# Patient Record
Sex: Male | Born: 1995 | Race: Black or African American | Hispanic: No | Marital: Single | Smoking: Current every day smoker
Health system: Southern US, Community
[De-identification: ages and names within clinical notes are randomized; demographics above are authoritative.]

## PROBLEM LIST (undated history)

## (undated) DIAGNOSIS — J40 Bronchitis, not specified as acute or chronic: Secondary | ICD-10-CM

## (undated) HISTORY — PX: APPENDECTOMY: SHX54

---
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
## 2015-07-08 ENCOUNTER — Encounter (HOSPITAL_BASED_OUTPATIENT_CLINIC_OR_DEPARTMENT_OTHER): Payer: Self-pay | Admitting: *Deleted

## 2015-07-08 ENCOUNTER — Emergency Department (HOSPITAL_BASED_OUTPATIENT_CLINIC_OR_DEPARTMENT_OTHER): Payer: Medicaid Other

## 2015-07-08 ENCOUNTER — Emergency Department (HOSPITAL_BASED_OUTPATIENT_CLINIC_OR_DEPARTMENT_OTHER)
Admission: EM | Admit: 2015-07-08 | Discharge: 2015-07-08 | Disposition: A | Discharge status: Home / Self Care | Payer: Medicaid Other | Attending: Emergency Medicine | Admitting: Emergency Medicine

## 2015-07-08 DIAGNOSIS — Z720 Tobacco use: Secondary | ICD-10-CM | POA: Diagnosis not present

## 2015-07-08 DIAGNOSIS — Y92410 Unspecified street and highway as the place of occurrence of the external cause: Secondary | ICD-10-CM | POA: Insufficient documentation

## 2015-07-08 DIAGNOSIS — Z8709 Personal history of other diseases of the respiratory system: Secondary | ICD-10-CM | POA: Insufficient documentation

## 2015-07-08 DIAGNOSIS — S199XXA Unspecified injury of neck, initial encounter: Secondary | ICD-10-CM | POA: Diagnosis not present

## 2015-07-08 DIAGNOSIS — S3992XA Unspecified injury of lower back, initial encounter: Secondary | ICD-10-CM | POA: Insufficient documentation

## 2015-07-08 DIAGNOSIS — Y998 Other external cause status: Secondary | ICD-10-CM | POA: Insufficient documentation

## 2015-07-08 DIAGNOSIS — M545 Low back pain, unspecified: Secondary | ICD-10-CM

## 2015-07-08 DIAGNOSIS — Z79899 Other long term (current) drug therapy: Secondary | ICD-10-CM | POA: Diagnosis not present

## 2015-07-08 DIAGNOSIS — Y9389 Activity, other specified: Secondary | ICD-10-CM | POA: Insufficient documentation

## 2015-07-08 HISTORY — DX: Bronchitis, not specified as acute or chronic: J40

## 2015-07-08 MED ORDER — IBUPROFEN 800 MG PO TABS
800.0000 mg | ORAL_TABLET | Freq: Once | ORAL | Status: AC
  Administered 2015-07-08: 800 mg via ORAL
  Filled 2015-07-08: qty 1

## 2015-07-08 MED ORDER — CYCLOBENZAPRINE HCL 10 MG PO TABS: 10.0000 mg | ORAL_TABLET | Freq: Two times a day (BID) | ORAL | Status: DC | PRN

## 2015-07-08 MED ORDER — HYDROCODONE-ACETAMINOPHEN 5-325 MG PO TABS: 2.0000 | ORAL_TABLET | Freq: Four times a day (QID) | ORAL | Status: DC | PRN

## 2015-07-08 MED ORDER — NAPROXEN 500 MG PO TABS: 500.0000 mg | ORAL_TABLET | Freq: Two times a day (BID) | ORAL | Status: DC
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
## 2015-07-08 NOTE — Discharge Instructions (Signed)
Back Exercises These exercises may help you when beginning to rehabilitate your injury. Your symptoms may resolve with or without further involvement from your physician, physical therapist or athletic trainer. While completing these exercises, remember:   Restoring tissue flexibility helps normal motion to return to the joints. This allows healthier, less painful movement and activity.  An effective stretch should be held for at least 30 seconds.  A stretch should never be painful. You should only feel a gentle lengthening or release in the stretched tissue. STRETCH - Extension, Prone on Elbows   Lie on your stomach on the floor, a bed will be too soft. Place your palms about shoulder width apart and at the height of your head.  Place your elbows under your shoulders. If this is too painful, stack pillows under your chest.  Allow your body to relax so that your hips drop lower and make contact more completely with the floor.  Hold this position for __________ seconds.  Slowly return to lying flat on the floor. Repeat __________ times. Complete this exercise __________ times per day.  RANGE OF MOTION - Extension, Prone Press Ups   Lie on your stomach on the floor, a bed will be too soft. Place your palms about shoulder width apart and at the height of your head.  Keeping your back as relaxed as possible, slowly straighten your elbows while keeping your hips on the floor. You may adjust the placement of your hands to maximize your comfort. As you gain motion, your hands will come more underneath your shoulders.  Hold this position __________ seconds.  Slowly return to lying flat on the floor. Repeat __________ times. Complete this exercise __________ times per day.  RANGE OF MOTION- Quadruped, Neutral Spine   Assume a hands and knees position on a firm surface. Keep your hands under your shoulders and your knees under your hips. You may place padding under your knees for  comfort.  Drop your head and point your tail bone toward the ground below you. This will round out your low back like an angry cat. Hold this position for __________ seconds.  Slowly lift your head and release your tail bone so that your back sags into a large arch, like an old horse.  Hold this position for __________ seconds.  Repeat this until you feel limber in your low back.  Now, find your "sweet spot." This will be the most comfortable position somewhere between the two previous positions. This is your neutral spine. Once you have found this position, tense your stomach muscles to support your low back.  Hold this position for __________ seconds. Repeat __________ times. Complete this exercise __________ times per day.  STRETCH - Flexion, Single Knee to Chest   Lie on a firm bed or floor with both legs extended in front of you.  Keeping one leg in contact with the floor, bring your opposite knee to your chest. Hold your leg in place by either grabbing behind your thigh or at your knee.  Pull until you feel a gentle stretch in your low back. Hold __________ seconds.  Slowly release your grasp and repeat the exercise with the opposite side. Repeat __________ times. Complete this exercise __________ times per day.  STRETCH - Hamstrings, Standing  Stand or sit and extend your right / left leg, placing your foot on a chair or foot stool  Keeping a slight arch in your low back and your hips straight forward.  Lead with your chest and   lean forward at the waist until you feel a gentle stretch in the back of your right / left knee or thigh. (When done correctly, this exercise requires leaning only a small distance.)  Hold this position for __________ seconds. Repeat __________ times. Complete this stretch __________ times per day. STRENGTHENING - Deep Abdominals, Pelvic Tilt   Lie on a firm bed or floor. Keeping your legs in front of you, bend your knees so they are both pointed  toward the ceiling and your feet are flat on the floor.  Tense your lower abdominal muscles to press your low back into the floor. This motion will rotate your pelvis so that your tail bone is scooping upwards rather than pointing at your feet or into the floor.  With a gentle tension and even breathing, hold this position for __________ seconds. Repeat __________ times. Complete this exercise __________ times per day.  STRENGTHENING - Abdominals, Crunches   Lie on a firm bed or floor. Keeping your legs in front of you, bend your knees so they are both pointed toward the ceiling and your feet are flat on the floor. Cross your arms over your chest.  Slightly tip your chin down without bending your neck.  Tense your abdominals and slowly lift your trunk high enough to just clear your shoulder blades. Lifting higher can put excessive stress on the low back and does not further strengthen your abdominal muscles.  Control your return to the starting position. Repeat __________ times. Complete this exercise __________ times per day.  STRENGTHENING - Quadruped, Opposite UE/LE Lift   Assume a hands and knees position on a firm surface. Keep your hands under your shoulders and your knees under your hips. You may place padding under your knees for comfort.  Find your neutral spine and gently tense your abdominal muscles so that you can maintain this position. Your shoulders and hips should form a rectangle that is parallel with the floor and is not twisted.  Keeping your trunk steady, lift your right hand no higher than your shoulder and then your left leg no higher than your hip. Make sure you are not holding your breath. Hold this position __________ seconds.  Continuing to keep your abdominal muscles tense and your back steady, slowly return to your starting position. Repeat with the opposite arm and leg. Repeat __________ times. Complete this exercise __________ times per day. Document Released:  12/07/2005 Document Revised: 02/11/2012 Document Reviewed: 03/03/2009 [employer] Patient Information 2015 [employer]. This information is not intended to replace advice given to you by your health care provider. Make sure you discuss any questions you have with your health care provider.  

## 2016-03-02 ENCOUNTER — Emergency Department (HOSPITAL_BASED_OUTPATIENT_CLINIC_OR_DEPARTMENT_OTHER)
Admission: EM | Admit: 2016-03-02 | Discharge: 2016-03-02 | Disposition: A | Discharge status: Home / Self Care | Payer: Medicaid Other | Attending: Emergency Medicine | Admitting: Emergency Medicine

## 2016-03-02 ENCOUNTER — Encounter (HOSPITAL_BASED_OUTPATIENT_CLINIC_OR_DEPARTMENT_OTHER): Payer: Self-pay | Admitting: *Deleted

## 2016-03-02 DIAGNOSIS — Z79899 Other long term (current) drug therapy: Secondary | ICD-10-CM | POA: Diagnosis not present

## 2016-03-02 DIAGNOSIS — R197 Diarrhea, unspecified: Secondary | ICD-10-CM | POA: Insufficient documentation

## 2016-03-02 DIAGNOSIS — Z8709 Personal history of other diseases of the respiratory system: Secondary | ICD-10-CM | POA: Insufficient documentation

## 2016-03-02 DIAGNOSIS — F17210 Nicotine dependence, cigarettes, uncomplicated: Secondary | ICD-10-CM | POA: Diagnosis not present

## 2016-03-02 DIAGNOSIS — R112 Nausea with vomiting, unspecified: Secondary | ICD-10-CM | POA: Diagnosis not present

## 2016-03-02 DIAGNOSIS — R1084 Generalized abdominal pain: Secondary | ICD-10-CM | POA: Diagnosis present

## 2016-03-02 LAB — BASIC METABOLIC PANEL
ANION GAP: 9 (ref 5–15)
BUN: 11 mg/dL (ref 6–20)
CO2: 26 mmol/L (ref 22–32)
Calcium: 9.5 mg/dL (ref 8.9–10.3)
Chloride: 104 mmol/L (ref 101–111)
Creatinine, Ser: 1.07 mg/dL (ref 0.61–1.24)
GFR calc Af Amer: >60 mL/min (ref >60)
GFR calc non Af Amer: >60 mL/min (ref >60)
GLUCOSE: 132 mg/dL — AB (ref 65–99)
POTASSIUM: 3.7 mmol/L (ref 3.5–5.1)
Sodium: 139 mmol/L (ref 135–145)

## 2016-03-02 LAB — URINALYSIS, ROUTINE W REFLEX MICROSCOPIC
Bilirubin Urine: NEGATIVE
Glucose, UA: NEGATIVE mg/dL
HGB URINE DIPSTICK: NEGATIVE
Ketones, ur: NEGATIVE mg/dL
Nitrite: NEGATIVE
Protein, ur: 30 mg/dL — AB
SPECIFIC GRAVITY, URINE: 1.027 (ref 1.005–1.030)
pH: 8.5 — ABNORMAL HIGH (ref 5.0–8.0)

## 2016-03-02 LAB — CBC WITH DIFFERENTIAL/PLATELET
Basophils Absolute: 0 10*3/uL (ref 0.0–0.1)
Basophils Relative: 0 %
EOS PCT: 3 %
Eosinophils Absolute: 0.3 10*3/uL (ref 0.0–0.7)
HCT: 48.6 % (ref 39.0–52.0)
HEMOGLOBIN: 16.7 g/dL (ref 13.0–17.0)
LYMPHS PCT: 21 %
Lymphs Abs: 1.9 10*3/uL (ref 0.7–4.0)
MCH: 29.9 pg (ref 26.0–34.0)
MCHC: 34.4 g/dL (ref 30.0–36.0)
MCV: 87.1 fL (ref 78.0–100.0)
Monocytes Absolute: 0.5 10*3/uL (ref 0.1–1.0)
Monocytes Relative: 6 %
NEUTROS ABS: 6.2 10*3/uL (ref 1.7–7.7)
NEUTROS PCT: 70 %
Platelets: 260 10*3/uL (ref 150–400)
RBC: 5.58 MIL/uL (ref 4.22–5.81)
RDW: 13.6 % (ref 11.5–15.5)
WBC: 8.9 10*3/uL (ref 4.0–10.5)

## 2016-03-02 LAB — LIPASE, BLOOD: Lipase: 34 U/L (ref 11–51)

## 2016-03-02 LAB — URINE MICROSCOPIC-ADD ON: RBC / HPF: NONE SEEN RBC/hpf (ref 0–5)

## 2016-03-02 MED ORDER — ONDANSETRON HCL 4 MG/2ML IJ SOLN
4.0000 mg | Freq: Once | INTRAMUSCULAR | Status: AC
  Administered 2016-03-02: 4 mg via INTRAVENOUS
  Filled 2016-03-02: qty 2

## 2016-03-02 MED ORDER — ONDANSETRON 4 MG PO TBDP: 4.0000 mg | ORAL_TABLET | Freq: Three times a day (TID) | ORAL | Status: DC | PRN

## 2016-03-02 MED ORDER — HYDROMORPHONE HCL 1 MG/ML IJ SOLN
0.5000 mg | Freq: Once | INTRAMUSCULAR | Status: AC
  Administered 2016-03-02: 0.5 mg via INTRAVENOUS
  Filled 2016-03-02: qty 1

## 2016-03-02 MED ORDER — SODIUM CHLORIDE 0.9 % IV BOLUS (SEPSIS)
1000.0000 mL | Freq: Once | INTRAVENOUS | Status: AC
  Administered 2016-03-02: 1000 mL via INTRAVENOUS

## 2016-03-02 MED ORDER — OXYCODONE HCL 5 MG PO TABS: 5.0000 mg | ORAL_TABLET | Freq: Four times a day (QID) | ORAL | Status: DC | PRN

## 2016-03-02 NOTE — ED Notes (Signed)
Pt father requests this rn at desk. Pt father states "You told me that the doctor had seen my son. You weren't supposed to tell me nothing. You broke that law when you told me the doctor had seen my son, I want to talk to your supervisor!"

## 2016-03-02 NOTE — ED Notes (Signed)
Pt made aware to return if symptoms worsen or if any life threatening symptoms occur.   

## 2016-03-02 NOTE — Discharge Instructions (Signed)
Take zofran as needed for nausea. Use the pain medication only as needed! This can make you very drowsy - please do not drink or drive on this medication. This can also make constipation worse.   Drink plenty of fluids.  Follow up with your primary physician in 1-2 days.  Return to the ER for new or worsening symptoms, any additional concerns.

## 2016-03-02 NOTE — ED Notes (Signed)
Lorin PicketScott, AD notified of pt request to speak with my supervisor.

## 2016-03-02 NOTE — ED Provider Notes (Signed)
CSN: [id number]     Arrival date & time 03/02/16  1333 History   First MD Initiated Contact with Patient 03/02/16 1442     Chief Complaint  Patient presents with  . Abdominal Pain     (Consider location/radiation/quality/duration/timing/severity/associated sxs/prior Treatment) Patient is a 20 y.o. male presenting with abdominal pain. The history is provided by the patient and medical records. No language interpreter was used.  Abdominal Pain Associated symptoms: diarrhea, nausea and vomiting   Associated symptoms: no chills, no cough, no dysuria, no fever, no shortness of breath and no sore throat    Dennis Simmons is a 20 y.o. male  with no pertinent PMH who presents to the Emergency Department complaining of gradually worsening generalized abdominal pain since yesterday. Associated symptoms include nausea, NBNB emesis (5 episodes today), two episodes of small amount of non-bloody diarrhea. Mother with similar sxs a few days prior. "Anti diarrhea" medication of mother's taken PTA, however patient does not know name of med. No other medications taken for symptoms. Patient denies chest pain, shortness of breath, fever.  Past Medical History  Diagnosis Date  . Bronchitis    History reviewed. No pertinent past surgical history. History reviewed. No pertinent family history. Social History  Substance Use Topics  . Smoking status: Current Every Day Smoker -- 0.50 packs/day    Types: Cigars, Cigarettes  . Smokeless tobacco: None  . Alcohol Use: No    Review of Systems  Constitutional: Negative for fever and chills.  HENT: Negative for congestion and sore throat.   Eyes: Negative for visual disturbance.  Respiratory: Negative for cough and shortness of breath.   Cardiovascular: Negative.   Gastrointestinal: Positive for nausea, vomiting, abdominal pain and diarrhea.  Genitourinary: Negative for dysuria.  Musculoskeletal: Negative for back pain and neck pain.  Skin: Negative for rash.   Neurological: Negative for dizziness and headaches.      Allergies  Tylenol  Home Medications   Prior to Admission medications   Medication Sig Start Date End Date Taking? Authorizing Provider  albuterol (PROVENTIL HFA;VENTOLIN HFA) 108 (90 BASE) MCG/ACT inhaler Inhale 2 puffs into the lungs every 6 (six) hours as needed for wheezing or shortness of breath.    Historical Provider, MD  amphetamine-dextroamphetamine (ADDERALL XR) 30 MG 24 hr capsule Take 30 mg by mouth daily.    Historical Provider, MD  ondansetron (ZOFRAN ODT) 4 MG disintegrating tablet Take 1 tablet (4 mg total) by mouth every 8 (eight) hours as needed for nausea or vomiting. 03/02/16   Chase PicketJaime Pilcher Emrey Thornley, PA-C  oxyCODONE (ROXICODONE) 5 MG immediate release tablet Take 1 tablet (5 mg total) by mouth every 6 (six) hours as needed for severe pain. 03/02/16   Shala Baumbach Pilcher [place] Throckmorton, PA-C   BP 111/65 mmHg  Pulse 85  Temp(Src) 98.6 F (37 C) (Oral)  Resp 18  Ht 6\' 3"  (1.905 m)  Wt 124.739 kg  BMI 34.37 kg/m2  SpO2 99% Physical Exam  Constitutional: He is oriented to person, place, and time. He appears well-developed and well-nourished.  Alert  HENT:  Head: Normocephalic and atraumatic.  Mouth/Throat: Oropharynx is clear and moist. No oropharyngeal exudate.  Tacky mucous membranes.  Cardiovascular: Normal rate, regular rhythm, normal heart sounds and intact distal pulses.  Exam reveals no gallop and no friction rub.   No murmur heard. Pulmonary/Chest: Effort normal and breath sounds normal. No respiratory distress. He has no wheezes. He has no rales.  Abdominal: Soft. Bowel sounds are normal. He exhibits  no distension and no mass. There is tenderness (Generalized). There is no rebound, no guarding and no tenderness at McBurney's point.  Negative psoas, negative obturator.   Musculoskeletal: Normal range of motion.  Neurological: He is alert and oriented to person, place, and time.  Skin: Skin is warm and dry.  Cap  refill less than 3 seconds.  Nursing note and vitals reviewed.   ED Course  Procedures (including critical care time) Labs Review Labs Reviewed  URINALYSIS, ROUTINE W REFLEX MICROSCOPIC (NOT AT [facility]) - Abnormal; Notable for the following:    APPearance CLOUDY (*)    pH 8.5 (*)    Protein, ur 30 (*)    Leukocytes, UA TRACE (*)    All other components within normal limits  BASIC METABOLIC PANEL - Abnormal; Notable for the following:    Glucose, Bld 132 (*)    All other components within normal limits  URINE MICROSCOPIC-ADD ON - Abnormal; Notable for the following:    Squamous Epithelial / LPF 0-5 (*)    Bacteria, UA FEW (*)    All other components within normal limits  CBC WITH DIFFERENTIAL/PLATELET  LIPASE, BLOOD    Imaging Review No results found. I have personally reviewed and evaluated these images and lab results as part of my medical decision-making.   EKG Interpretation None      MDM   Final diagnoses:  Generalized abdominal pain   Dennis Simmons is an otherwise healthy 20 y.o. male who presents for generalized abdominal pain, n/v/d. Small amounts of loose stool today. On exam, patient with non-surgical abdomen. Generalized TTP. Abdomen was difficult to assess given patient was writhing around in the bed and requesting pain meds. 0.5mg  Dilaudid and zofran given, then will reassess. Mildly tachycardic, 1L fluids given.   Patient re-evaluated after pain medication and feels improved. Repeat abdominal exam with non-surgical abdomen and generalized tenderness. No emesis since medication was given. Heart rate has improved.  Patient is nontoxic, nonseptic appearing. Patient's pain and other symptoms adequately managed in emergency department. Fluid bolus given. Labs reviewed and reassuring. Patient does not meet the SIRS or Sepsis criteria. No indication of appendicitis, bowel obstruction, bowel perforation, cholecystitis, diverticulitis. Patient discharged home with  symptomatic treatment and given strict instructions for follow-up with their primary care physician. I have also discussed reasons to return immediately to the ER. Patient expresses understanding and agrees with plan. All questions answered.   [facility] Dala Breault, PA-C 03/02/16 1715  Vanetta Mulders, MD 03/07/16 [phone]

## 2016-03-02 NOTE — ED Notes (Signed)
Requested urine x2

## 2016-03-02 NOTE — ED Notes (Signed)
Pt father calls this rn again, states "My daughter just called me and says no one has seen him and you all are not giving him any pain medicine." explained again to pt father that I cannot discuss specific pt information with him, but that pt has been seen by a physician. Pt shouting at this rn "You don't have to tell me shit! I'm on my way, I'll be there in an hour and a half and you'll tell me then!" and hangs up.

## 2016-03-02 NOTE — ED Notes (Signed)
Pt requesting pain medication, Dennis MuirJamie, [place] notified

## 2016-03-02 NOTE — ED Notes (Signed)
Pt reports abdominal pain since yesterday.  Pt very vague in triage.

## 2016-03-02 NOTE — ED Notes (Signed)
Pt removed IV by self because he was upset that he was having to wait too long for the doctor.

## 2016-03-02 NOTE — ED Notes (Signed)
PA at bedside.

## 2016-03-02 NOTE — ED Notes (Signed)
Pt father calls this rn. Pt father states "I need you to give me some information on my son, I need to know what is going on." explained to pt father that due to HIPPA laws I cannot give him any information about his son over the phone. Asked father to call his son on his cell phone, and he can tell him what is going on, father states "we are estranged, he won't talk to me." once again explained to pt father that I cannot disclose any information about pt without permission from the pt. Pt father is angry, shouting at this rn, "that is bullshit!" and hangs up phone.

## 2016-03-02 NOTE — ED Notes (Signed)
Lorin PicketScott, AD talking with pt father outside of room.

## 2017-03-03 ENCOUNTER — Encounter (HOSPITAL_BASED_OUTPATIENT_CLINIC_OR_DEPARTMENT_OTHER): Payer: Self-pay | Admitting: *Deleted

## 2017-03-03 ENCOUNTER — Emergency Department (HOSPITAL_BASED_OUTPATIENT_CLINIC_OR_DEPARTMENT_OTHER)
Admission: EM | Admit: 2017-03-03 | Discharge: 2017-03-03 | Disposition: A | Discharge status: Home / Self Care | Payer: Medicaid Other | Attending: Emergency Medicine | Admitting: Emergency Medicine

## 2017-03-03 DIAGNOSIS — F17210 Nicotine dependence, cigarettes, uncomplicated: Secondary | ICD-10-CM | POA: Diagnosis not present

## 2017-03-03 DIAGNOSIS — K0889 Other specified disorders of teeth and supporting structures: Secondary | ICD-10-CM | POA: Insufficient documentation

## 2017-03-03 MED ORDER — BUPIVACAINE-EPINEPHRINE (PF) 0.5% -1:200000 IJ SOLN
1.8000 mL | Freq: Once | INTRAMUSCULAR | Status: AC
  Administered 2017-03-03: 1.8 mL
  Filled 2017-03-03: qty 1.8

## 2017-03-03 MED ORDER — PENICILLIN V POTASSIUM 500 MG PO TABS: 500.0000 mg | ORAL_TABLET | Freq: Three times a day (TID) | ORAL | 0 refills | Status: DC

## 2017-03-03 MED ORDER — PENICILLIN V POTASSIUM 250 MG PO TABS
500.0000 mg | ORAL_TABLET | Freq: Once | ORAL | Status: AC
  Administered 2017-03-03: 500 mg via ORAL
  Filled 2017-03-03: qty 2

## 2017-03-03 MED ORDER — OXYCODONE HCL 5 MG PO TABS: 5.0000 mg | ORAL_TABLET | Freq: Four times a day (QID) | ORAL | 0 refills | Status: DC | PRN

## 2017-03-03 NOTE — ED Provider Notes (Signed)
[facility] Provider Note: Lowella Dell, MD, FACEP  CSN: [id number] MRN: [id number] ARRIVAL: 03/03/17 at 0624 ROOM: MH11/[facility]11   CHIEF COMPLAINT  Toothache   HISTORY OF PRESENT ILLNESS  Dennis Simmons is a 21 y.o. male who has had a fractured right upper first premolar for some time. That tooth began hurting about 3 weeks ago and has gradually worsened. The pain has become severe. He has been taking naproxen without adequate relief. Pain is worse with eating or drinking. He has a Education officer, community in town, Dr. Everardo Beals, whom he intends to call tomorrow. He is requesting an antibiotic.  Consultation with the [facility] state controlled substances database reveals the patient has received 3 narcotic prescriptions in the past year.    Past Medical History:  Diagnosis Date  . Bronchitis     Past Surgical History:  Procedure Laterality Date  . APPENDECTOMY      History reviewed. No pertinent family history.  Social History  Substance Use Topics  . Smoking status: Current Every Day Smoker    Packs/day: 0.50    Types: Cigars, Cigarettes  . Smokeless tobacco: Never Used  . Alcohol use No    Prior to Admission medications   Medication Sig Start Date End Date Taking? Authorizing Provider  amphetamine-dextroamphetamine (ADDERALL XR) 30 MG 24 hr capsule Take 30 mg by mouth daily.    Historical Provider, MD  oxyCODONE (ROXICODONE) 5 MG immediate release tablet Take 1 tablet (5 mg total) by mouth every 6 (six) hours as needed for severe pain. 03/03/17   Marisha Renier, MD  penicillin v potassium (VEETID) 500 MG tablet Take 1 tablet (500 mg total) by mouth 3 (three) times daily. 03/03/17   Paula Libra, MD    Allergies Tylenol [acetaminophen]   REVIEW OF SYSTEMS  Negative except as noted here or in the History of Present Illness.   PHYSICAL EXAMINATION  Initial Vital Signs Blood pressure (!) 142/82, pulse 68, temperature 98 F (36.7 C), temperature source Oral, resp. rate 16,  SpO2 98 %.  Examination General: Well-developed, well-nourished male in no acute distress; appearance consistent with age of record HENT: normocephalic; atraumatic; right upper first premolar carious and/or fractured gumline with tenderness to percussion Eyes: pupils equal, round and reactive to light; extraocular muscles intact Neck: supple; no lymphadenopathy Heart: regular rate and rhythm Lungs: clear to auscultation bilaterally Abdomen: soft; nondistended; nontender; no masses or hepatosplenomegaly; bowel sounds present Extremities: No deformity; full range of motion; pulses normal Neurologic: Awake, alert and oriented; motor function intact in all extremities and symmetric; no facial droop Skin: Warm and dry Psychiatric: Normal mood and affect   RESULTS  Summary of this visit's results, reviewed by myself:   EKG Interpretation  Date/Time:    Ventricular Rate:    PR Interval:    QRS Duration:   QT Interval:    QTC Calculation:   R Axis:     Text Interpretation:        Laboratory Studies: No results found for this or any previous visit (from the past 24 hour(s)). Imaging Studies: No results found.  ED COURSE  Nursing notes and initial vitals signs, including pulse oximetry, reviewed.  Vitals:   03/03/17 0634  BP: (!) 142/82  Pulse: 68  Resp: 16  Temp: 98 F (36.7 C)  TempSrc: Oral  SpO2: 98%    PROCEDURES   DENTAL BLOCK 1.8 milliliters of 0.5% bupivacaine with epinephrine were injected into the buccal fold adjacent to the right upper  first premolar. The patient tolerated this well and there were no immediate complications. Adequate analgesia was obtained.   ED DIAGNOSES     ICD-9-CM ICD-10-CM   1. Pain, dental 525.9 K08.89        Paula Libra, MD 03/03/17 [phone]

## 2017-03-03 NOTE — ED Notes (Signed)
"  feel better", given Rx x2, denies questions or needs, given work note,

## 2017-03-03 NOTE — ED Triage Notes (Signed)
Here for R upper dental pain, missing tooth noted,onset 3 weeks ago, reports tooth had a root canal, then broke off, and has had intermittent pain, hasn't been able to care for tooth d/t moving from [facility] to [place]. Has a local dentist in [place]. (denies: sob, nv, dizziness, swelling or drainage). Taking Naproxen, last PTA.   Alert, NAD, calm, interactive, resps e/u, speaking in clear complete sentences, no dyspnea noted, skin W&D, VSS, EDP into room during triage.
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
## 2017-12-25 ENCOUNTER — Other Ambulatory Visit: Payer: Self-pay

## 2017-12-25 ENCOUNTER — Emergency Department (HOSPITAL_BASED_OUTPATIENT_CLINIC_OR_DEPARTMENT_OTHER)
Admission: EM | Admit: 2017-12-25 | Discharge: 2017-12-25 | Disposition: A | Discharge status: Home / Self Care | Payer: No Typology Code available for payment source | Attending: Emergency Medicine | Admitting: Emergency Medicine

## 2017-12-25 ENCOUNTER — Encounter (HOSPITAL_BASED_OUTPATIENT_CLINIC_OR_DEPARTMENT_OTHER): Payer: Self-pay

## 2017-12-25 DIAGNOSIS — S3992XA Unspecified injury of lower back, initial encounter: Secondary | ICD-10-CM | POA: Diagnosis present

## 2017-12-25 DIAGNOSIS — Z79899 Other long term (current) drug therapy: Secondary | ICD-10-CM | POA: Diagnosis not present

## 2017-12-25 DIAGNOSIS — Y92410 Unspecified street and highway as the place of occurrence of the external cause: Secondary | ICD-10-CM | POA: Diagnosis not present

## 2017-12-25 DIAGNOSIS — Y999 Unspecified external cause status: Secondary | ICD-10-CM | POA: Diagnosis not present

## 2017-12-25 DIAGNOSIS — F17210 Nicotine dependence, cigarettes, uncomplicated: Secondary | ICD-10-CM | POA: Diagnosis not present

## 2017-12-25 DIAGNOSIS — Y939 Activity, unspecified: Secondary | ICD-10-CM | POA: Insufficient documentation

## 2017-12-25 DIAGNOSIS — S39012A Strain of muscle, fascia and tendon of lower back, initial encounter: Secondary | ICD-10-CM | POA: Insufficient documentation

## 2017-12-25 MED ORDER — NAPROXEN 250 MG PO TABS
500.0000 mg | ORAL_TABLET | Freq: Once | ORAL | Status: AC
  Administered 2017-12-25: 500 mg via ORAL
  Filled 2017-12-25: qty 2

## 2017-12-25 MED ORDER — CYCLOBENZAPRINE HCL 10 MG PO TABS
10.0000 mg | ORAL_TABLET | Freq: Once | ORAL | Status: AC
  Administered 2017-12-25: 10 mg via ORAL
  Filled 2017-12-25: qty 1

## 2017-12-25 MED ORDER — NAPROXEN 500 MG PO TABS: 500.0000 mg | ORAL_TABLET | Freq: Two times a day (BID) | ORAL | 0 refills | Status: DC | PRN

## 2017-12-25 MED ORDER — CYCLOBENZAPRINE HCL 10 MG PO TABS: 10.0000 mg | ORAL_TABLET | Freq: Three times a day (TID) | ORAL | 0 refills | Status: DC | PRN
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
## 2018-02-01 ENCOUNTER — Encounter (HOSPITAL_BASED_OUTPATIENT_CLINIC_OR_DEPARTMENT_OTHER): Payer: Self-pay | Admitting: Emergency Medicine

## 2018-02-01 ENCOUNTER — Emergency Department (HOSPITAL_BASED_OUTPATIENT_CLINIC_OR_DEPARTMENT_OTHER)
Admission: EM | Admit: 2018-02-01 | Discharge: 2018-02-01 | Disposition: A | Discharge status: Home / Self Care | Payer: Self-pay | Attending: Emergency Medicine | Admitting: Emergency Medicine

## 2018-02-01 ENCOUNTER — Other Ambulatory Visit: Payer: Self-pay

## 2018-02-01 DIAGNOSIS — F17210 Nicotine dependence, cigarettes, uncomplicated: Secondary | ICD-10-CM | POA: Insufficient documentation

## 2018-02-01 DIAGNOSIS — N509 Disorder of male genital organs, unspecified: Secondary | ICD-10-CM | POA: Insufficient documentation

## 2018-02-01 DIAGNOSIS — N489 Disorder of penis, unspecified: Secondary | ICD-10-CM

## 2018-02-01 DIAGNOSIS — Z79899 Other long term (current) drug therapy: Secondary | ICD-10-CM | POA: Insufficient documentation

## 2018-02-01 MED ORDER — VALACYCLOVIR HCL 500 MG PO TABS
1000.0000 mg | ORAL_TABLET | Freq: Once | ORAL | Status: AC
  Administered 2018-02-01: 1000 mg via ORAL
  Filled 2018-02-01: qty 2

## 2018-02-01 MED ORDER — VALACYCLOVIR HCL 1 G PO TABS: 1000.0000 mg | ORAL_TABLET | Freq: Three times a day (TID) | ORAL | 0 refills | Status: DC

## 2018-02-01 MED ORDER — CEPHALEXIN 500 MG PO CAPS: 500.0000 mg | ORAL_CAPSULE | Freq: Three times a day (TID) | ORAL | 0 refills | Status: DC

## 2018-02-01 MED ORDER — CEPHALEXIN 250 MG PO CAPS
500.0000 mg | ORAL_CAPSULE | Freq: Once | ORAL | Status: AC
  Administered 2018-02-01: 500 mg via ORAL
  Filled 2018-02-01: qty 2

## 2018-02-01 NOTE — ED Triage Notes (Signed)
Patient states that he was told 2 -3 days ago that his partner may have had an STD - he reports he now has a rash that has blistered in his groin

## 2018-02-01 NOTE — Discharge Instructions (Signed)
Contact your primary care physician to arrange follow-up in 7-10 days to discuss improvement, and testing.  Is not clear whether this is bacterial infection called folliculitis (internal infection of hair follicles), or possibly a herpes viral infection.  You are being started on treatment for both.  You have undergone testing for HIV, syphilis, and blood testing for herpes.  These results will be available in "my chart" through your [facility] system website in 1 week.  No sexual activity until seen in follow-up by primary care.

## 2018-02-01 NOTE — ED Provider Notes (Signed)
[facility] EMERGENCY DEPARTMENT Provider Note   CSN: [id number] Arrival date & time: 02/01/18  1720     History   Chief Complaint Chief Complaint  Patient presents with  . Rash    HPI Dennis Simmons is a 22 y.o. male.  Chief complaint is "sore on my penis".  HPI 22 year old male.  He noticed a small area that was "tender" and "felt like a lump".  This was at the top edge of his pubic hair.  He states it drained and went away.  He states that he has had multiple other red swollen areas erupt at the base of his penis bilaterally.  He also received a phone call from a partner that "told me I better get checked that they had something".  He does not know the nature of the positive test his partner may have had.  He has some discomfort but states it is "not bad".  No groin swelling.  No urethral discharge, dysuria, or other symptoms.  Past Medical History:  Diagnosis Date  . Bronchitis     There are no active problems to display for this patient.   Past Surgical History:  Procedure Laterality Date  . APPENDECTOMY         Home Medications    Prior to Admission medications   Medication Sig Start Date End Date Taking? Authorizing Provider  amphetamine-dextroamphetamine (ADDERALL XR) 30 MG 24 hr capsule Take 30 mg by mouth daily.    [provider]  cephALEXin (KEFLEX) 500 MG capsule Take 1 capsule (500 mg total) by mouth 3 (three) times daily. 02/01/18   Rolland PorterJames, Othello Dickenson, MD  cyclobenzaprine (FLEXERIL) 10 MG tablet Take 1 tablet (10 mg total) by mouth 3 (three) times daily as needed for muscle spasms. 12/25/17   Molpus, John, MD  naproxen (NAPROSYN) 500 MG tablet Take 1 tablet (500 mg total) by mouth 2 (two) times daily as needed (for back pain). 12/25/17   Molpus, John, MD  valACYclovir (VALTREX) 1000 MG tablet Take 1 tablet (1,000 mg total) by mouth 3 (three) times daily. 02/01/18   Rolland PorterJames, Ziyon Soltau, MD    Family History History reviewed. No pertinent family  history.  Social History Social History   Tobacco Use  . Smoking status: Current Every Day Smoker    Packs/day: 0.50    Types: Cigars, Cigarettes  . Smokeless tobacco: Never Used  Substance Use Topics  . Alcohol use: Yes  . Drug use: Yes    Types: Marijuana     Allergies   Tylenol [acetaminophen]   Review of Systems Review of Systems  Constitutional: Negative for appetite change, chills, diaphoresis, fatigue and fever.  HENT: Negative for mouth sores, sore throat and trouble swallowing.   Eyes: Negative for visual disturbance.  Respiratory: Negative for cough, chest tightness, shortness of breath and wheezing.   Cardiovascular: Negative for chest pain.  Gastrointestinal: Negative for abdominal distention, abdominal pain, diarrhea, nausea and vomiting.  Endocrine: Negative for polydipsia, polyphagia and polyuria.  Genitourinary: Negative for dysuria, frequency and hematuria.       Resolving "crusted thing" in his pubic hair.  Red bumps on the base of his penis.  Musculoskeletal: Negative for gait problem.  Skin: Negative for color change, pallor and rash.  Neurological: Negative for dizziness, syncope, light-headedness and headaches.  Hematological: Does not bruise/bleed easily.  Psychiatric/Behavioral: Negative for behavioral problems and confusion.     Physical Exam Updated Vital Signs BP (!) 146/93 (BP Location: Left Arm)  Pulse 100   Temp 99.3 F (37.4 C) (Oral)   Resp 18   Ht 6\' 4"  (1.93 m)   Wt 99.8 kg (220 lb)   SpO2 100%   BMI 26.78 kg/m   Physical Exam  Constitutional: He is oriented to person, place, and time. He appears well-developed and well-nourished. No distress.  HENT:  Head: Normocephalic.  Eyes: Conjunctivae are normal. Pupils are equal, round, and reactive to light. No scleral icterus.  Neck: Normal range of motion. Neck supple. No thyromegaly present.  Cardiovascular: Normal rate and regular rhythm. Exam reveals no gallop and no friction  rub.  No murmur heard. Pulmonary/Chest: Effort normal and breath sounds normal. No respiratory distress. He has no wheezes. He has no rales.  Abdominal: Soft. Bowel sounds are normal. He exhibits no distension. There is no tenderness. There is no rebound.  Genitourinary:  Genitourinary Comments: It appears to be single area of folliculitis in the midline pubic hair almost on the upper abdominal wall.  He has multiple areas of erythema that are somewhat crusting on the shaft proximally.  He does not had any distal to where his pubic hair ends.  Is not had any lesions of the glans.  His urethra appears normal.  He has a single nontender left inguinal lymph node.  Musculoskeletal: Normal range of motion.  Neurological: He is alert and oriented to person, place, and time.  Skin: Skin is warm and dry. No rash noted.  Psychiatric: He has a normal mood and affect. His behavior is normal.     ED Treatments / Results  Labs (all labs ordered are listed, but only abnormal results are displayed) Labs Reviewed  RPR  HSV 1 ANTIBODY, IGG  HSV 2 ANTIBODY, IGG  HIV ANTIBODY (ROUTINE TESTING)  GC/CHLAMYDIA PROBE AMP ([place]) NOT AT [facility]    EKG  EKG Interpretation None       Radiology No results found.  Procedures Procedures (including critical care time)  Medications Ordered in ED Medications  cephALEXin (KEFLEX) capsule 500 mg (not administered)  valACYclovir (VALTREX) tablet 1,000 mg (not administered)     Initial Impression / Assessment and Plan / ED Course  I have reviewed the triage vital signs and the nursing notes.  Pertinent labs & imaging results that were available during my care of the patient were reviewed by me and considered in my medical decision making (see chart for details).    Minimal discomfort.  Otherwise would be consistent with herpetic lesion.  Blood testing for herpes IgG, syphilis, HIV.  Swab obtained by myself for GC chlamydia.  Will place him on  Valtrex, and Keflex.  Possibility of folliculitis is still in the differential.  Of encouraged him to use my chart to follow-up results and make arrangement with his primary care physician in 7-10 days to discuss improvement, test results, and further plans.  Avoid sexual activity until cleared by primary care  Final Clinical Impressions(s) / ED Diagnoses   Final diagnoses:  Penile lesion    ED Discharge Orders        Ordered    valACYclovir (VALTREX) 1000 MG tablet  3 times daily     02/01/18 1802    cephALEXin (KEFLEX) 500 MG capsule  3 times daily     02/01/18 1802       Rolland Porter, MD 02/01/18 1807

## 2018-02-03 LAB — HSV 2 ANTIBODY, IGG

## 2018-02-03 LAB — HSV 1 ANTIBODY, IGG

## 2018-02-03 LAB — RPR: RPR: NONREACTIVE

## 2018-02-03 LAB — HIV ANTIBODY (ROUTINE TESTING W REFLEX): HIV SCREEN 4TH GENERATION: NONREACTIVE

## 2018-02-04 LAB — GC/CHLAMYDIA PROBE AMP (~~LOC~~) NOT AT ARMC
Chlamydia: POSITIVE — AB
NEISSERIA GONORRHEA: NEGATIVE

## 2018-02-06 ENCOUNTER — Telehealth: Payer: Self-pay | Admitting: Medical

## 2018-02-06 DIAGNOSIS — A749 Chlamydial infection, unspecified: Secondary | ICD-10-CM

## 2018-02-06 MED ORDER — AZITHROMYCIN 250 MG PO TABS: 1000.0000 mg | ORAL_TABLET | Freq: Once | ORAL | 0 refills | Status: AC

## 2018-02-06 NOTE — Telephone Encounter (Addendum)
Ella P Aulds tested positive for  Chlamydia. Patient was called by RN and allergies and pharmacy confirmed. Rx sent to pharmacy of choice.   Marny LowensteinWenzel, Marise Knapper N, PA-C 02/06/2018 2:49 PM      ----- Message from Kathe BectonLori S Berdik, RN sent at 02/06/2018 12:19 PM EST ----- This patient tested positive for :  chlamydia  He ,"is allergic to  Acetaminophen" I have informed the patient of his results and confirmed his pharmacy is correct in his chart. Please send Rx.   Thank you,   Kathe BectonBerdik, Lori S, RN   [facility]

## 2018-04-22 ENCOUNTER — Emergency Department (HOSPITAL_BASED_OUTPATIENT_CLINIC_OR_DEPARTMENT_OTHER): Payer: No Typology Code available for payment source

## 2018-04-22 ENCOUNTER — Encounter (HOSPITAL_BASED_OUTPATIENT_CLINIC_OR_DEPARTMENT_OTHER): Payer: Self-pay | Admitting: Adult Health

## 2018-04-22 ENCOUNTER — Other Ambulatory Visit: Payer: Self-pay

## 2018-04-22 ENCOUNTER — Emergency Department (HOSPITAL_BASED_OUTPATIENT_CLINIC_OR_DEPARTMENT_OTHER)
Admission: EM | Admit: 2018-04-22 | Discharge: 2018-04-22 | Disposition: A | Discharge status: Home / Self Care | Payer: No Typology Code available for payment source | Attending: Emergency Medicine | Admitting: Emergency Medicine

## 2018-04-22 DIAGNOSIS — Z79899 Other long term (current) drug therapy: Secondary | ICD-10-CM | POA: Insufficient documentation

## 2018-04-22 DIAGNOSIS — S9031XA Contusion of right foot, initial encounter: Secondary | ICD-10-CM

## 2018-04-22 DIAGNOSIS — S99921A Unspecified injury of right foot, initial encounter: Secondary | ICD-10-CM | POA: Diagnosis present

## 2018-04-22 DIAGNOSIS — F17210 Nicotine dependence, cigarettes, uncomplicated: Secondary | ICD-10-CM | POA: Insufficient documentation

## 2018-04-22 DIAGNOSIS — S99929A Unspecified injury of unspecified foot, initial encounter: Secondary | ICD-10-CM

## 2018-04-22 DIAGNOSIS — Y9389 Activity, other specified: Secondary | ICD-10-CM | POA: Diagnosis not present

## 2018-04-22 DIAGNOSIS — Y998 Other external cause status: Secondary | ICD-10-CM | POA: Diagnosis not present

## 2018-04-22 DIAGNOSIS — Y929 Unspecified place or not applicable: Secondary | ICD-10-CM | POA: Insufficient documentation

## 2018-04-22 NOTE — Discharge Instructions (Addendum)
You were seen in the Emergency department for a crush injury to your right foot.  Your x-rays did not show an obvious fracture.  You should keep the area elevated and apply ice to the top of your foot.  Tylenol for pain.  Return if any worsening symptoms.
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
## 2018-09-06 ENCOUNTER — Inpatient Hospital Stay (HOSPITAL_BASED_OUTPATIENT_CLINIC_OR_DEPARTMENT_OTHER)
Admission: EM | Admit: 2018-09-06 | Discharge: 2018-09-07 | DRG: 683 | Disposition: A | Discharge status: Home / Self Care | Payer: Self-pay | Attending: Internal Medicine | Admitting: Internal Medicine

## 2018-09-06 ENCOUNTER — Encounter (HOSPITAL_BASED_OUTPATIENT_CLINIC_OR_DEPARTMENT_OTHER): Payer: Self-pay | Admitting: Emergency Medicine

## 2018-09-06 ENCOUNTER — Other Ambulatory Visit: Payer: Self-pay

## 2018-09-06 DIAGNOSIS — Z79899 Other long term (current) drug therapy: Secondary | ICD-10-CM

## 2018-09-06 DIAGNOSIS — Z720 Tobacco use: Secondary | ICD-10-CM | POA: Diagnosis present

## 2018-09-06 DIAGNOSIS — R8271 Bacteriuria: Secondary | ICD-10-CM | POA: Diagnosis present

## 2018-09-06 DIAGNOSIS — E860 Dehydration: Secondary | ICD-10-CM | POA: Diagnosis present

## 2018-09-06 DIAGNOSIS — F1210 Cannabis abuse, uncomplicated: Secondary | ICD-10-CM | POA: Diagnosis present

## 2018-09-06 DIAGNOSIS — Z8249 Family history of ischemic heart disease and other diseases of the circulatory system: Secondary | ICD-10-CM

## 2018-09-06 DIAGNOSIS — F17290 Nicotine dependence, other tobacco product, uncomplicated: Secondary | ICD-10-CM | POA: Diagnosis present

## 2018-09-06 DIAGNOSIS — Z886 Allergy status to analgesic agent status: Secondary | ICD-10-CM

## 2018-09-06 DIAGNOSIS — E876 Hypokalemia: Secondary | ICD-10-CM | POA: Diagnosis present

## 2018-09-06 DIAGNOSIS — Z716 Tobacco abuse counseling: Secondary | ICD-10-CM

## 2018-09-06 DIAGNOSIS — F909 Attention-deficit hyperactivity disorder, unspecified type: Secondary | ICD-10-CM | POA: Diagnosis present

## 2018-09-06 DIAGNOSIS — N179 Acute kidney failure, unspecified: Principal | ICD-10-CM | POA: Diagnosis present

## 2018-09-06 DIAGNOSIS — F17210 Nicotine dependence, cigarettes, uncomplicated: Secondary | ICD-10-CM | POA: Diagnosis present

## 2018-09-06 DIAGNOSIS — D72829 Elevated white blood cell count, unspecified: Secondary | ICD-10-CM | POA: Diagnosis present

## 2018-09-06 DIAGNOSIS — M6282 Rhabdomyolysis: Secondary | ICD-10-CM | POA: Diagnosis present

## 2018-09-06 DIAGNOSIS — R8281 Pyuria: Secondary | ICD-10-CM | POA: Diagnosis present

## 2018-09-06 LAB — BASIC METABOLIC PANEL
ANION GAP: 16 — AB (ref 5–15)
BUN: 40 mg/dL — AB (ref 6–20)
CO2: 26 mmol/L (ref 22–32)
Calcium: 9.2 mg/dL (ref 8.9–10.3)
Chloride: 95 mmol/L — ABNORMAL LOW (ref 98–111)
Creatinine, Ser: 2.62 mg/dL — ABNORMAL HIGH (ref 0.61–1.24)
GFR calc Af Amer: 38 mL/min — ABNORMAL LOW (ref >60)
GFR, EST NON AFRICAN AMERICAN: 33 mL/min — AB (ref >60)
GLUCOSE: 107 mg/dL — AB (ref 70–99)
POTASSIUM: 3.3 mmol/L — AB (ref 3.5–5.1)
SODIUM: 137 mmol/L (ref 135–145)

## 2018-09-06 LAB — CBC WITH DIFFERENTIAL/PLATELET
BASOS ABS: 0 10*3/uL (ref 0.0–0.1)
BASOS PCT: 0 %
EOS ABS: 0 10*3/uL (ref 0.0–0.7)
Eosinophils Relative: 0 %
HCT: 51.1 % (ref 39.0–52.0)
HEMOGLOBIN: 18.7 g/dL — AB (ref 13.0–17.0)
LYMPHS ABS: 2.1 10*3/uL (ref 0.7–4.0)
Lymphocytes Relative: 13 %
MCH: 30.3 pg (ref 26.0–34.0)
MCHC: 36.6 g/dL — ABNORMAL HIGH (ref 30.0–36.0)
MCV: 82.8 fL (ref 78.0–100.0)
Monocytes Absolute: 1.2 10*3/uL — ABNORMAL HIGH (ref 0.1–1.0)
Monocytes Relative: 7 %
Neutro Abs: 12.6 10*3/uL — ABNORMAL HIGH (ref 1.7–7.7)
Neutrophils Relative %: 79 %
Platelets: 281 10*3/uL (ref 150–400)
RBC: 6.17 MIL/uL — ABNORMAL HIGH (ref 4.22–5.81)
RDW: 14.4 % (ref 11.5–15.5)
WBC: 15.9 10*3/uL — ABNORMAL HIGH (ref 4.0–10.5)

## 2018-09-06 LAB — URINALYSIS, ROUTINE W REFLEX MICROSCOPIC
Glucose, UA: NEGATIVE mg/dL
Ketones, ur: 15 mg/dL — AB
LEUKOCYTES UA: NEGATIVE
NITRITE: NEGATIVE
PROTEIN: 30 mg/dL — AB
pH: 5 (ref 5.0–8.0)

## 2018-09-06 LAB — RAPID URINE DRUG SCREEN, HOSP PERFORMED
Amphetamines: NOT DETECTED
BENZODIAZEPINES: NOT DETECTED
Barbiturates: NOT DETECTED
COCAINE: NOT DETECTED
Opiates: NOT DETECTED
Tetrahydrocannabinol: POSITIVE — AB

## 2018-09-06 LAB — COMPREHENSIVE METABOLIC PANEL
ALT: 28 U/L (ref 0–44)
AST: 57 U/L — ABNORMAL HIGH (ref 15–41)
Albumin: 5.8 g/dL — ABNORMAL HIGH (ref 3.5–5.0)
Alkaline Phosphatase: 109 U/L (ref 38–126)
Anion gap: 20 — ABNORMAL HIGH (ref 5–15)
BUN: 43 mg/dL — ABNORMAL HIGH (ref 6–20)
CALCIUM: 10 mg/dL (ref 8.9–10.3)
CHLORIDE: 89 mmol/L — AB (ref 98–111)
CO2: 24 mmol/L (ref 22–32)
CREATININE: 4.07 mg/dL — AB (ref 0.61–1.24)
GFR, EST AFRICAN AMERICAN: 22 mL/min — AB (ref >60)
GFR, EST NON AFRICAN AMERICAN: 19 mL/min — AB (ref >60)
Glucose, Bld: 115 mg/dL — ABNORMAL HIGH (ref 70–99)
Potassium: 3.9 mmol/L (ref 3.5–5.1)
Sodium: 133 mmol/L — ABNORMAL LOW (ref 135–145)
Total Bilirubin: 2.4 mg/dL — ABNORMAL HIGH (ref 0.3–1.2)
Total Protein: 10 g/dL — ABNORMAL HIGH (ref 6.5–8.1)

## 2018-09-06 LAB — URINALYSIS, MICROSCOPIC (REFLEX)

## 2018-09-06 LAB — CREATININE, URINE, RANDOM: Creatinine, Urine: 527.53 mg/dL

## 2018-09-06 LAB — CK
CK TOTAL: 2735 U/L — AB (ref 49–397)
Total CK: 2582 U/L — ABNORMAL HIGH (ref 49–397)

## 2018-09-06 LAB — I-STAT VENOUS BLOOD GAS, ED
Acid-base deficit: 1 mmol/L (ref 0.0–2.0)
Bicarbonate: 24.6 mmol/L (ref 20.0–28.0)
O2 SAT: 79 %
PCO2 VEN: 43.1 mmHg — AB (ref 44.0–60.0)
PO2 VEN: 45 mmHg (ref 32.0–45.0)
Patient temperature: 98.5
TCO2: 26 mmol/L (ref 22–32)
pH, Ven: 7.365 (ref 7.250–7.430)

## 2018-09-06 LAB — TSH: TSH: 0.434 u[IU]/mL (ref 0.350–4.500)

## 2018-09-06 LAB — SODIUM, URINE, RANDOM: Sodium, Ur: 47 mmol/L

## 2018-09-06 LAB — PHOSPHORUS: PHOSPHORUS: 4.6 mg/dL (ref 2.5–4.6)

## 2018-09-06 LAB — MAGNESIUM: Magnesium: 2.5 mg/dL — ABNORMAL HIGH (ref 1.7–2.4)

## 2018-09-06 MED ORDER — ONDANSETRON HCL 4 MG PO TABS: 4.0000 mg | ORAL_TABLET | Freq: Four times a day (QID) | ORAL | Status: DC | PRN

## 2018-09-06 MED ORDER — SODIUM CHLORIDE 0.9 % IV BOLUS
1000.0000 mL | Freq: Once | INTRAVENOUS | Status: AC
  Administered 2018-09-06: 1000 mL via INTRAVENOUS

## 2018-09-06 MED ORDER — SODIUM CHLORIDE 0.9 % IV SOLN
INTRAVENOUS | Status: DC
  Administered 2018-09-06 – 2018-09-07 (×3): via INTRAVENOUS

## 2018-09-06 MED ORDER — POTASSIUM CHLORIDE CRYS ER 20 MEQ PO TBCR
40.0000 meq | EXTENDED_RELEASE_TABLET | Freq: Once | ORAL | Status: AC
  Administered 2018-09-06: 40 meq via ORAL
  Filled 2018-09-06: qty 2

## 2018-09-06 MED ORDER — NICOTINE 14 MG/24HR TD PT24
14.0000 mg | MEDICATED_PATCH | Freq: Every day | TRANSDERMAL | Status: DC
  Administered 2018-09-06 – 2018-09-07 (×2): 14 mg via TRANSDERMAL
  Filled 2018-09-06 (×2): qty 1

## 2018-09-06 MED ORDER — SODIUM CHLORIDE 0.9 % IV BOLUS
1000.0000 mL | Freq: Once | INTRAVENOUS | Status: AC
Start: 2018-09-06 — End: 2018-09-06
  Administered 2018-09-06: 1000 mL via INTRAVENOUS

## 2018-09-06 MED ORDER — ONDANSETRON HCL 4 MG/2ML IJ SOLN: 4.0000 mg | Freq: Four times a day (QID) | INTRAMUSCULAR | Status: DC | PRN

## 2018-09-06 NOTE — H&P (Signed)
DERRAN SEAR ZOX:[id number] DOB: 1996/01/16 DOA: 09/06/2018     PCP: System, Pcp Not In   Outpatient Specialists:   NONE   Patient arrived to ER on 09/06/18 at 0641  Patient coming from: home Lives alone,      Chief Complaint:  Chief Complaint  Patient presents with  . Emesis    HPI: Dennis Simmons is a 22 y.o. male with   significant medical history  Of ADHD on Adderall     Presented with nausea vomiting muscle aches while working in the heat.  No history of prior no history of renal disease in the past      While in ER: Noted to have rhabdomyolysis with CK 2500 and acute renal failure with creatinine of 4.0 Started on IV fluid rehydration The following Work up has been ordered so far:  Orders Placed This Encounter  Procedures  . Comprehensive metabolic panel  . CBC with Differential  . Urinalysis, Routine w reflex microscopic  . CK  . Blood gas, venous  . Urine rapid drug screen (hosp performed)  . Consult to hospitalist  . I-Stat venous blood gas, ED     Following Medications were ordered in ER: Medications  sodium chloride 0.9 % bolus 1,000 mL (has no administration in time range)  sodium chloride 0.9 % bolus 1,000 mL (0 mLs Intravenous Stopped 09/06/18 0859)    Significant initial  Findings: Abnormal Labs Reviewed  COMPREHENSIVE METABOLIC PANEL - Abnormal; Notable for the following components:      Result Value   Sodium 133 (*)    Chloride 89 (*)    Glucose, Bld 115 (*)    BUN 43 (*)    Creatinine, Ser 4.07 (*)    Total Protein 10.0 (*)    Albumin 5.8 (*)    AST 57 (*)    Total Bilirubin 2.4 (*)    GFR calc non Af Amer 19 (*)    GFR calc Af Amer 22 (*)    Anion gap 20 (*)    All other components within normal limits  CBC WITH DIFFERENTIAL/PLATELET - Abnormal; Notable for the following components:   WBC 15.9 (*)    RBC 6.17 (*)    Hemoglobin 18.7 (*)    MCHC 36.6 (*)    Neutro Abs 12.6 (*)    Monocytes Absolute 1.2 (*)    All other  components within normal limits  CK - Abnormal; Notable for the following components:   Total CK 2,582 (*)    All other components within normal limits  I-STAT VENOUS BLOOD GAS, ED - Abnormal; Notable for the following components:   pCO2, Ven 43.1 (*)    All other components within normal limits   VBG 7.365/43.1  Na 133 K 3.9  Cr    Up from baseline see below Lab Results  Component Value Date   CREATININE 2.62 (H) 09/06/2018   CREATININE 4.07 (H) 09/06/2018   CREATININE 1.07 03/02/2016      WBC 15.9  HG/HCT  Up from baseline see below    Component Value Date/Time   HGB 18.7 (H) 09/06/2018 0749   HCT 51.1 09/06/2018 0749     CK 2582    UA elevated WBC some bacteria but asymptomatic     ECG:  Not ordered   ED Triage Vitals  Enc Vitals Group     BP 09/06/18 0652 (!) 138/95     Pulse Rate 09/06/18 0652 (!) 103  Resp 09/06/18 0652 18     Temp 09/06/18 0652 97.8 F (36.6 C)     Temp Source 09/06/18 0652 Oral     SpO2 09/06/18 0652 99 %     Weight 09/06/18 0654 215 lb (97.5 kg)     Height 09/06/18 0654 6\' 3"  (1.905 m)     Head Circumference --      Peak Flow --      Pain Score 09/06/18 0653 8     Pain Loc --      Pain Edu? --      Excl. in GC? --   TMAX(24)@       Latest  Blood pressure (!) 146/84, pulse 67, temperature 97.8 F (36.6 C), temperature source Oral, resp. rate 16, height 6\' 3"  (1.905 m), weight 97.5 kg, SpO2 98 %.    Hospitalist was called for admission for dehydration and  rhabdomyolysis resulting in renal failure   Review of Systems:    Pertinent positives include: Muscle aches nausea vomiting,  joint pain  Constitutional:  No weight loss, night sweats, Fevers, chills, fatigue, weight loss  HEENT:  No headaches, Difficulty swallowing,Tooth/dental problems,Sore throat,  No sneezing, itching, ear ache, nasal congestion, post nasal drip,  Cardio-vascular:  No chest pain, Orthopnea, PND, anasarca, dizziness, palpitations.no Bilateral  lower extremity swelling  GI:  No heartburn, indigestion,   diarrhea, change in bowel habits, loss of appetite, melena, blood in stool, hematemesis Resp:  no shortness of breath at rest. No dyspnea on exertion, No excess mucus, no productive cough, No non-productive cough, No coughing up of blood.No change in color of mucus. No wheezing. Skin:  no rash or lesions. No jaundice GU:  no dysuria, change in color of urine, no urgency or frequency. No straining to urinate.  No flank pain.  Musculoskeletal:  No or no joint swelling. No decreased range of motion. No back pain.  Psych:  No change in mood or affect. No depression or anxiety. No memory loss.  Neuro: no localizing neurological complaints, no tingling, no weakness, no double vision, no gait abnormality, no slurred speech, no confusion  All systems reviewed and apart from HOPI all are negative  Past Medical History:   Past Medical History:  Diagnosis Date  . Bronchitis       Past Surgical History:  Procedure Laterality Date  . APPENDECTOMY      Social History:  Ambulatory   independently      reports that he has been smoking cigars and cigarettes. He has been smoking about 0.50 packs per day. He has never used smokeless tobacco. He reports that he drinks alcohol. He reports that he has current or past drug history. Drug: Marijuana.     Family History:   Family History  Problem Relation Age of Onset  . Hypertension Mother   . Bipolar disorder Mother   . Diabetes Father   . Gout Father   . Bipolar disorder Father     Allergies: Allergies  Allergen Reactions  . Tylenol [Acetaminophen] Other (See Comments)    Unknown, "was told by his father, that he had hives at as an infant"     Prior to Admission medications   Medication Sig Start Date End Date Taking? Authorizing Provider  amphetamine-dextroamphetamine (ADDERALL XR) 30 MG 24 hr capsule Take 30 mg by mouth daily.    [provider]  cephALEXin  (KEFLEX) 500 MG capsule Take 1 capsule (500 mg total) by mouth 3 (three) times daily. 02/01/18  Rolland Porter, MD  cyclobenzaprine (FLEXERIL) 10 MG tablet Take 1 tablet (10 mg total) by mouth 3 (three) times daily as needed for muscle spasms. 12/25/17   Molpus, John, MD  naproxen (NAPROSYN) 500 MG tablet Take 1 tablet (500 mg total) by mouth 2 (two) times daily as needed (for back pain). 12/25/17   Molpus, John, MD  valACYclovir (VALTREX) 1000 MG tablet Take 1 tablet (1,000 mg total) by mouth 3 (three) times daily. 02/01/18   Rolland Porter, MD   Physical Exam: Blood pressure (!) 146/84, pulse 67, temperature 97.8 F (36.6 C), temperature source Oral, resp. rate 16, height 6\' 3"  (1.905 m), weight 97.5 kg, SpO2 98 %. 1. General:  in No Acute distress   Chronically ill   -appearing 2. Psychological: Alert and  Oriented 3. Head/ENT:     Dry Mucous Membranes                          Head Non traumatic, neck supple                          Normal  Dentition 4. SKIN:   decreased Skin turgor,  Skin clean Dry and intact no rash 5. Heart: Regular rate and rhythm no Murmur, no Rub or gallop 6. Lungs:  Clear to auscultation bilaterally, no wheezes or crackles   7. Abdomen: Soft,  non-tender, Non distended   obese   8. Lower extremities: no clubbing, cyanosis, or  edema 9. Neurologically Grossly intact, moving all 4 extremities equally   10. MSK: Normal range of motion   LABS:     Recent Labs  Lab 09/06/18 0749  WBC 15.9*  NEUTROABS 12.6*  HGB 18.7*  HCT 51.1  MCV 82.8  PLT 281   Basic Metabolic Panel: Recent Labs  Lab 09/06/18 0749  NA 133*  K 3.9  CL 89*  CO2 24  GLUCOSE 115*  BUN 43*  CREATININE 4.07*  CALCIUM 10.0      Recent Labs  Lab 09/06/18 0749  AST 57*  ALT 28  ALKPHOS 109  BILITOT 2.4*  PROT 10.0*  ALBUMIN 5.8*   No results for input(s): LIPASE, AMYLASE in the last 168 hours. No results for input(s): AMMONIA in the last 168 hours.    HbA1C: No results for  input(s): HGBA1C in the last 72 hours. CBG: No results for input(s): GLUCAP in the last 168 hours.    Urine analysis:      Component Value Date/Time   COLORURINE AMBER (A) 09/06/2018 0909   APPEARANCEUR CLOUDY (A) 09/06/2018 0909   LABSPEC >1.030 (H) 09/06/2018 0909   PHURINE 5.0 09/06/2018 0909   GLUCOSEU NEGATIVE 09/06/2018 0909   HGBUR MODERATE (A) 09/06/2018 0909   BILIRUBINUR MODERATE (A) 09/06/2018 0909   KETONESUR 15 (A) 09/06/2018 0909   PROTEINUR 30 (A) 09/06/2018 0909   UROBILINOGEN 1.0 12/26/2014 0303   NITRITE NEGATIVE 09/06/2018 0909   LEUKOCYTESUR NEGATIVE 09/06/2018 0909       Cultures:    Component Value Date/Time   SDES THROAT 11/21/2014 2330   SPECREQUEST NONE 11/21/2014 2330   CULT  11/21/2014 2330    No Beta Hemolytic Streptococci Isolated Performed at [facility]    REPTSTATUS 11/24/2014 FINAL 11/21/2014 2330     Radiological Exams on Admission: No results found.  Chart has been reviewed    Assessment/Plan   22 y.o. male with   significant medical  history  Of ADHD on Adderall      Admitted for dehydration and  rhabdomyolysis resulting in renal failure  Present on Admission: . Rhabdomyolysis obtain serial CK and follow if kidney function does not recover will need nephrology consult . Dehydration rehydrate check orthostatics . AKI (acute kidney injury) (HCC) secondary to severe dehydration we will obtain urine electrolytes rehydrate currently creatinine is improving with maximum IV therapy will continue to monitor . Tobacco abuse-  - Spoke about importance of quitting spent 5 minutes discussing options for treatment, prior attempts at quitting, and dangers of smoking  -At this point patient is   interested in quitting  - order nicotine patch   - nursing tobacco cessation protocol  . Leukocytosis -suspect secondary to hemoconcentration will repeat after fluid rehydration . ADHD -patient not taking Adderall at home we will continue  to hold . Bacteriuria with pyuria -no symptoms of UTI no dysuria or fever abdominal discomfort.  Will obtain urine culture and follow   Other plan as per orders.  DVT prophylaxis:  SCD     Code Status:  FULL CODE  per patient   I had personally discussed CODE STATUS with patient and family   Family Communication:   Family  at  Bedside  plan of care was discussed with  Sister, , father,   Disposition Plan:       To home once workup is complete and patient is stable                      Consults called: none  Admission status:    inpatient     Expect 2 midnight stay secondary to severity of patient's current illness    Severe lab abnormalities including elevated creatinine 4.0 and CK 2500    I expect  patient to be hospitalized for 2 midnights requiring inpatient medical care.  Patient is at high risk for adverse outcome (such as loss of life or disability) if not treated.  Indication for inpatient stay as follows:    Need for IV fluids, IV rate controling medications, IV antihypertensives, IV medications       Level of care     tele  For 12H        Ajiah Mcglinn 09/06/2018,  11:05AM  [facility]  Pager [phone]   after 2 AM please page floor coverage PA If 7AM-7PM, please contact the day team taking care of the patient  [email or web address]  Password [email or web address]

## 2018-09-06 NOTE — Plan of Care (Signed)
21 year old male presents with lower extremity cramping nausea and vomiting and pain her lower back and legs. No fever has been outside in the heat.  rhabdo CK 2500 Acute renal Failure Cr 4.0  Accepted to [facility] bed  Dennis Simmons 9:26 AM

## 2018-09-06 NOTE — ED Triage Notes (Addendum)
Pt states he woke up yesterday with cramping in his legs  Pt went to work but the cramping got worse  Pt works outside in the [employer] states he had three episodes of vomiting yesterday at work and the cramping got worse  Pt states he had it throughout the night and had more vomiting after he got home  Pt states when he vomits he has cramping up in his neck  While triaging pt he started to c/o cramping in his back and right side

## 2018-09-06 NOTE — ED Provider Notes (Signed)
[facility] EMERGENCY DEPARTMENT Provider Note   CSN: [id number] Arrival date & time: 09/06/18  7829     History   Chief Complaint Chief Complaint  Patient presents with  . Emesis    HPI Reymond P How is a 22 y.o. male.  Patient is a 22 year old male who presents with muscle cramping.  He states he started having some muscle cramping yesterday, mostly in his lower back and in his legs.  It has gotten worse throughout today.  Any time he tries to move his feet, his legs cramp.  He is also had some cramping in his hands.  He has had several episodes of vomiting yesterday but no diarrhea.  No abdominal pain.  No fevers.  He has been working out in the heat a lot.  He states he has not really had very good oral intake.  Denies any history of diabetes.  No reported alcohol or drug use.  No numbness or weakness to his extremities.     Past Medical History:  Diagnosis Date  . Bronchitis     Patient Active Problem List   Diagnosis Date Noted  . Rhabdomyolysis 09/06/2018  . Dehydration 09/06/2018  . AKI (acute kidney injury) (HCC) 09/06/2018  . Tobacco abuse 09/06/2018  . Leukocytosis 09/06/2018  . ADHD 09/06/2018    Past Surgical History:  Procedure Laterality Date  . APPENDECTOMY          Home Medications    Prior to Admission medications   Medication Sig Start Date End Date Taking? Authorizing Provider  amphetamine-dextroamphetamine (ADDERALL XR) 30 MG 24 hr capsule Take 30 mg by mouth daily.    [provider]  cephALEXin (KEFLEX) 500 MG capsule Take 1 capsule (500 mg total) by mouth 3 (three) times daily. 02/01/18   Rolland Porter, MD  cyclobenzaprine (FLEXERIL) 10 MG tablet Take 1 tablet (10 mg total) by mouth 3 (three) times daily as needed for muscle spasms. 12/25/17   Molpus, John, MD  naproxen (NAPROSYN) 500 MG tablet Take 1 tablet (500 mg total) by mouth 2 (two) times daily as needed (for back pain). 12/25/17   Molpus, John, MD  valACYclovir  (VALTREX) 1000 MG tablet Take 1 tablet (1,000 mg total) by mouth 3 (three) times daily. 02/01/18   Rolland Porter, MD    Family History Family History  Problem Relation Age of Onset  . Hypertension Mother   . Bipolar disorder Mother   . Diabetes Father   . Gout Father   . Bipolar disorder Father     Social History Social History   Tobacco Use  . Smoking status: Current Every Day Smoker    Packs/day: 0.50    Types: Cigars, Cigarettes  . Smokeless tobacco: Never Used  Substance Use Topics  . Alcohol use: Yes    Comment: rare  . Drug use: Yes    Types: Marijuana     Allergies   Tylenol [acetaminophen]   Review of Systems Review of Systems  Constitutional: Negative for chills, diaphoresis, fatigue and fever.  HENT: Negative for congestion, rhinorrhea and sneezing.   Eyes: Negative.   Respiratory: Negative for cough, chest tightness and shortness of breath.   Cardiovascular: Negative for chest pain and leg swelling.  Gastrointestinal: Positive for nausea and vomiting. Negative for abdominal pain, blood in stool and diarrhea.  Genitourinary: Negative for difficulty urinating, flank pain, frequency and hematuria.  Musculoskeletal: Positive for myalgias. Negative for arthralgias and back pain.  Skin: Negative for rash.  Neurological:  Negative for dizziness, speech difficulty, weakness, numbness and headaches.     Physical Exam Updated Vital Signs BP (!) 141/91   Pulse (!) 55   Temp 97.8 F (36.6 C) (Oral)   Resp 19   Ht 6\' 3"  (1.905 m)   Wt 97.5 kg   SpO2 91%   BMI 26.87 kg/m   Physical Exam  Constitutional: He is oriented to person, place, and time. He appears well-developed and well-nourished.  HENT:  Head: Normocephalic and atraumatic.  Eyes: Pupils are equal, round, and reactive to light.  Neck: Normal range of motion. Neck supple.  Cardiovascular: Normal rate, regular rhythm and normal heart sounds.  Pulmonary/Chest: Effort normal and breath sounds normal.  No respiratory distress. He has no wheezes. He has no rales. He exhibits no tenderness.  Abdominal: Soft. Bowel sounds are normal. There is no tenderness. There is no rebound and no guarding.  Musculoskeletal: Normal range of motion. He exhibits no edema.  No swelling to the extremities, distal pulses are intact.  He has normal sensation and motor function in all extremities  Lymphadenopathy:    He has no cervical adenopathy.  Neurological: He is alert and oriented to person, place, and time.  Skin: Skin is warm and dry. No rash noted.  Psychiatric: He has a normal mood and affect.     ED Treatments / Results  Labs (all labs ordered are listed, but only abnormal results are displayed) Labs Reviewed  COMPREHENSIVE METABOLIC PANEL - Abnormal; Notable for the following components:      Result Value   Sodium 133 (*)    Chloride 89 (*)    Glucose, Bld 115 (*)    BUN 43 (*)    Creatinine, Ser 4.07 (*)    Total Protein 10.0 (*)    Albumin 5.8 (*)    AST 57 (*)    Total Bilirubin 2.4 (*)    GFR calc non Af Amer 19 (*)    GFR calc Af Amer 22 (*)    Anion gap 20 (*)    All other components within normal limits  CBC WITH DIFFERENTIAL/PLATELET - Abnormal; Notable for the following components:   WBC 15.9 (*)    RBC 6.17 (*)    Hemoglobin 18.7 (*)    MCHC 36.6 (*)    Neutro Abs 12.6 (*)    Monocytes Absolute 1.2 (*)    All other components within normal limits  URINALYSIS, ROUTINE W REFLEX MICROSCOPIC - Abnormal; Notable for the following components:   Color, Urine AMBER (*)    APPearance CLOUDY (*)    Specific Gravity, Urine >1.030 (*)    Hgb urine dipstick MODERATE (*)    Bilirubin Urine MODERATE (*)    Ketones, ur 15 (*)    Protein, ur 30 (*)    All other components within normal limits  CK - Abnormal; Notable for the following components:   Total CK 2,582 (*)    All other components within normal limits  RAPID URINE DRUG SCREEN, HOSP PERFORMED - Abnormal; Notable for the  following components:   Tetrahydrocannabinol POSITIVE (*)    All other components within normal limits  URINALYSIS, MICROSCOPIC (REFLEX) - Abnormal; Notable for the following components:   Bacteria, UA MANY (*)    All other components within normal limits  I-STAT VENOUS BLOOD GAS, ED - Abnormal; Notable for the following components:   pCO2, Ven 43.1 (*)    All other components within normal limits  BLOOD GAS, VENOUS  SODIUM, URINE, RANDOM  CREATININE, URINE, RANDOM    EKG None  Radiology No results found.  Procedures Procedures (including critical care time)  Medications Ordered in ED Medications  sodium chloride 0.9 % bolus 1,000 mL (1,000 mLs Intravenous New Bag/Given 09/06/18 0934)  sodium chloride 0.9 % bolus 1,000 mL (0 mLs Intravenous Stopped 09/06/18 0859)     Initial Impression / Assessment and Plan / ED Course  I have reviewed the triage vital signs and the nursing notes.  Pertinent labs & imaging results that were available during my care of the patient were reviewed by me and considered in my medical decision making (see chart for details).     Patient is a 22 year old male who presents with cramping and vomiting.  He had been working out in the heat.  He denies any overexertion type injury.  He does have evidence of mild rhabdomyolysis with a CK of 2500.  He also has acute renal failure with a creatinine of 4.07.  His potassium is normal.  He is otherwise well-appearing.  He was given IV fluids.  I did not start him on a bicarb drip given that his CK is not markedly elevated.  I spoke with Dr. Adela Glimpse with the hospitalist service who is excepted the patient for admission.  Initially we are going to send the patient to [facility] but they currently do not have any beds and given that the patient has a normal potassium and no need for urgent dialysis, it was decided that he can be admitted to [facility].  Final Clinical Impressions(s) / ED Diagnoses   Final  diagnoses:  Dehydration  Acute renal failure, unspecified acute renal failure type [facility])  Non-traumatic rhabdomyolysis    ED Discharge Orders    None       Rolan Bucco, MD 09/06/18 [phone]

## 2018-09-07 DIAGNOSIS — F1210 Cannabis abuse, uncomplicated: Secondary | ICD-10-CM

## 2018-09-07 DIAGNOSIS — R8271 Bacteriuria: Secondary | ICD-10-CM

## 2018-09-07 DIAGNOSIS — E860 Dehydration: Secondary | ICD-10-CM

## 2018-09-07 DIAGNOSIS — N179 Acute kidney failure, unspecified: Principal | ICD-10-CM

## 2018-09-07 DIAGNOSIS — Z720 Tobacco use: Secondary | ICD-10-CM

## 2018-09-07 DIAGNOSIS — M6282 Rhabdomyolysis: Secondary | ICD-10-CM

## 2018-09-07 DIAGNOSIS — R8281 Pyuria: Secondary | ICD-10-CM

## 2018-09-07 DIAGNOSIS — D72829 Elevated white blood cell count, unspecified: Secondary | ICD-10-CM

## 2018-09-07 LAB — COMPREHENSIVE METABOLIC PANEL
ALK PHOS: 74 U/L (ref 38–126)
ALT: 23 U/L (ref 0–44)
AST: 57 U/L — ABNORMAL HIGH (ref 15–41)
Albumin: 3.6 g/dL (ref 3.5–5.0)
Anion gap: 6 (ref 5–15)
BILIRUBIN TOTAL: 1.5 mg/dL — AB (ref 0.3–1.2)
BUN: 30 mg/dL — ABNORMAL HIGH (ref 6–20)
CALCIUM: 8.6 mg/dL — AB (ref 8.9–10.3)
CO2: 28 mmol/L (ref 22–32)
CREATININE: 1.15 mg/dL (ref 0.61–1.24)
Chloride: 105 mmol/L (ref 98–111)
GFR calc non Af Amer: >60 mL/min (ref >60)
Glucose, Bld: 100 mg/dL — ABNORMAL HIGH (ref 70–99)
Potassium: 4.5 mmol/L (ref 3.5–5.1)
SODIUM: 139 mmol/L (ref 135–145)
Total Protein: 6.5 g/dL (ref 6.5–8.1)

## 2018-09-07 LAB — CBC
HCT: 44.4 % (ref 39.0–52.0)
Hemoglobin: 15.1 g/dL (ref 13.0–17.0)
MCH: 30.3 pg (ref 26.0–34.0)
MCHC: 34 g/dL (ref 30.0–36.0)
MCV: 89 fL (ref 78.0–100.0)
PLATELETS: 235 10*3/uL (ref 150–400)
RBC: 4.99 MIL/uL (ref 4.22–5.81)
RDW: 14 % (ref 11.5–15.5)
WBC: 6.9 10*3/uL (ref 4.0–10.5)

## 2018-09-07 LAB — MAGNESIUM: Magnesium: 2.3 mg/dL (ref 1.7–2.4)

## 2018-09-07 LAB — PHOSPHORUS: Phosphorus: 2.4 mg/dL — ABNORMAL LOW (ref 2.5–4.6)

## 2018-09-07 LAB — TSH: TSH: 0.344 u[IU]/mL — ABNORMAL LOW (ref 0.350–4.500)

## 2018-09-07 LAB — CK: CK TOTAL: 2203 U/L — AB (ref 49–397)

## 2018-09-07 NOTE — Care Management (Addendum)
Spoke to pt and he has [employer]. He goes to see Dr Paulino Rily at [facility] at [place]. Isidoro Donning RN CCM Case Mgmt phone [phone]

## 2018-09-07 NOTE — Progress Notes (Signed)
Reviewed discharge information with patient and girlfriend. Answered all questions. Ptable to teach back medications and reasons to contact MD/911. Patient verbalizes importance of PCP follow up appointment. Provided with work note. Provided smoking cessation education with teachback and handout, resource numbers given.  Earnest Conroy. Clelia Croft, RN

## 2018-09-08 LAB — URINE CULTURE: CULTURE: NO GROWTH
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
## 2019-06-13 ENCOUNTER — Encounter (HOSPITAL_COMMUNITY): Payer: Self-pay | Admitting: *Deleted

## 2019-06-13 ENCOUNTER — Other Ambulatory Visit: Payer: Self-pay

## 2019-06-13 ENCOUNTER — Emergency Department (HOSPITAL_COMMUNITY)
Admission: EM | Admit: 2019-06-13 | Discharge: 2019-06-13 | Disposition: A | Discharge status: Home / Self Care | Payer: Medicaid Other | Attending: Emergency Medicine | Admitting: Emergency Medicine

## 2019-06-13 DIAGNOSIS — Z202 Contact with and (suspected) exposure to infections with a predominantly sexual mode of transmission: Secondary | ICD-10-CM | POA: Insufficient documentation

## 2019-06-13 NOTE — ED Triage Notes (Signed)
Pt states his friend who he had relations with called today and told him she has herpes. He wants to be checked slthough he states he does not have symptoms.

## 2019-06-13 NOTE — ED Provider Notes (Signed)
[name] DEPT Provider Note   CSN: [id number] Arrival date & time: 06/13/19  1400     History   Chief Complaint Chief Complaint  Patient presents with  . SEXUALLY TRANSMITTED DISEASE    HPI Dennis Simmons is a 23 y.o. male.     23 year old male presents concern for possible STD exposure.  Patient states that he was told by a former partner that she has herpes.  He himself has had no symptoms.  No rashes or penile drainage or discharge.  That partner had intercourse with somebody else after him and she thinks that is probably where she got it.     Past Medical History:  Diagnosis Date  . Bronchitis     Patient Active Problem List   Diagnosis Date Noted  . Rhabdomyolysis 09/06/2018  . Dehydration 09/06/2018  . AKI (acute kidney injury) ([place]) 09/06/2018  . Tobacco abuse 09/06/2018  . Leukocytosis 09/06/2018  . ADHD 09/06/2018  . Bacteriuria with pyuria 09/06/2018    Past Surgical History:  Procedure Laterality Date  . APPENDECTOMY          Home Medications    Prior to Admission medications   Medication Sig Start Date End Date Taking? Authorizing Provider  albuterol (PROVENTIL HFA;VENTOLIN HFA) 108 (90 Base) MCG/ACT inhaler Inhale 2 puffs into the lungs every 6 (six) hours as needed for wheezing or shortness of breath.    [provider]  gabapentin (NEURONTIN) 100 MG capsule Take 100 mg by mouth at bedtime as needed (neuropathy).     [provider]    Family History Family History  Problem Relation Age of Onset  . Hypertension Mother   . Bipolar disorder Mother   . Diabetes Father   . Gout Father   . Bipolar disorder Father     Social History Social History   Tobacco Use  . Smoking status: Current Every Day Smoker    Packs/day: 0.50    Types: Cigars, Cigarettes  . Smokeless tobacco: Never Used  Substance Use Topics  . Alcohol use: Yes    Comment: rare  . Drug use: Yes    Types: Marijuana     Allergies   Tylenol [acetaminophen]   Review of Systems Review of Systems  All other systems reviewed and are negative.    Physical Exam Updated Vital Signs BP (!) 135/92 (BP Location: Left Arm)   Pulse (!) 102   Temp 99 F (37.2 C) (Oral)   Resp 18   SpO2 99%   Physical Exam Vitals signs and nursing note reviewed.  Constitutional:      General: He is not in acute distress.    Appearance: Normal appearance. He is well-developed. He is not toxic-appearing.  HENT:     Head: Normocephalic and atraumatic.  Eyes:     General: Lids are normal.     Conjunctiva/sclera: Conjunctivae normal.     Pupils: Pupils are equal, round, and reactive to light.  Neck:     Musculoskeletal: Normal range of motion and neck supple.     Thyroid: No thyroid mass.     Trachea: No tracheal deviation.  Cardiovascular:     Rate and Rhythm: Normal rate and regular rhythm.     Heart sounds: Normal heart sounds. No murmur. No gallop.   Pulmonary:     Effort: Pulmonary effort is normal. No respiratory distress.     Breath sounds: Normal breath sounds. No stridor. No decreased breath sounds, wheezing, rhonchi or  rales.  Abdominal:     General: Bowel sounds are normal. There is no distension.     Palpations: Abdomen is soft.     Tenderness: There is no abdominal tenderness. There is no rebound.  Genitourinary:    Penis: Circumcised. No discharge or lesions.   Musculoskeletal: Normal range of motion.        General: No tenderness.  Skin:    General: Skin is warm and dry.     Findings: No abrasion or rash.  Neurological:     Mental Status: He is alert and oriented to person, place, and time.     GCS: GCS eye subscore is 4. GCS verbal subscore is 5. GCS motor subscore is 6.     Cranial Nerves: No cranial nerve deficit.     Sensory: No sensory deficit.  Psychiatric:        Speech: Speech normal.        Behavior: Behavior normal.      ED Treatments / Results  Labs (all labs ordered are  listed, but only abnormal results are displayed) Labs Reviewed - No data to display  EKG None  Radiology No results found.  Procedures Procedures (including critical care time)  Medications Ordered in ED Medications - No data to display   Initial Impression / Assessment and Plan / ED Course  I have reviewed the triage vital signs and the nursing notes.  Pertinent labs & imaging results that were available during my care of the patient were reviewed by me and considered in my medical decision making (see chart for details).        Patient is genital exam without acute findings.  Was instructed to follow-up L department should he have any problems  Final Clinical Impressions(s) / ED Diagnoses   Final diagnoses:  None    ED Discharge Orders    None       Lorre NickAllen, Alistar Mcenery, MD 06/13/19 1428

## 2019-07-06 ENCOUNTER — Emergency Department (HOSPITAL_COMMUNITY)
Admission: EM | Admit: 2019-07-06 | Discharge: 2019-07-06 | Disposition: A | Discharge status: Home / Self Care | Payer: Self-pay | Attending: Emergency Medicine | Admitting: Emergency Medicine

## 2019-07-06 ENCOUNTER — Other Ambulatory Visit: Payer: Self-pay

## 2019-07-06 ENCOUNTER — Encounter (HOSPITAL_COMMUNITY): Payer: Self-pay

## 2019-07-06 DIAGNOSIS — F17210 Nicotine dependence, cigarettes, uncomplicated: Secondary | ICD-10-CM | POA: Insufficient documentation

## 2019-07-06 DIAGNOSIS — L0231 Cutaneous abscess of buttock: Secondary | ICD-10-CM | POA: Insufficient documentation

## 2019-07-06 MED ORDER — PREDNISONE 20 MG PO TABS: 40.0000 mg | ORAL_TABLET | Freq: Every day | ORAL | 0 refills | Status: DC

## 2019-07-06 MED ORDER — LIDOCAINE HCL 2 % IJ SOLN
20.0000 mL | Freq: Once | INTRAMUSCULAR | Status: AC
  Administered 2019-07-06: 400 mg
  Filled 2019-07-06: qty 20

## 2019-07-06 MED ORDER — CEPHALEXIN 500 MG PO CAPS: 500.0000 mg | ORAL_CAPSULE | Freq: Four times a day (QID) | ORAL | 0 refills | Status: DC

## 2019-07-06 NOTE — ED Triage Notes (Signed)
Pt has abscess on the outside right buttocks. Pt states he noticed it a few days ago.

## 2019-07-06 NOTE — ED Provider Notes (Signed)
[facility] DEPT Provider Note   CSN: [id number] Arrival date & time: 07/06/19  9381     History   Chief Complaint Chief Complaint  Patient presents with  . Abscess    HPI Dennis Simmons is a 23 y.o. male.     HPI Patient presents to the emergency department with an abscess to the right lateral buttocks.  The patient states that he noticed it 3 days ago but got worse last night.  The patient states that he did not take any medications prior to arrival for his symptoms.  Patient states he did not try any treatments on the area.  Patient denies fever, nausea, vomiting, weakness, dizziness, near-syncope or syncope. Past Medical History:  Diagnosis Date  . Bronchitis     Patient Active Problem List   Diagnosis Date Noted  . Rhabdomyolysis 09/06/2018  . Dehydration 09/06/2018  . AKI (acute kidney injury) ([place]) 09/06/2018  . Tobacco abuse 09/06/2018  . Leukocytosis 09/06/2018  . ADHD 09/06/2018  . Bacteriuria with pyuria 09/06/2018    Past Surgical History:  Procedure Laterality Date  . APPENDECTOMY          Home Medications    Prior to Admission medications   Medication Sig Start Date End Date Taking? Authorizing Provider  albuterol (PROVENTIL HFA;VENTOLIN HFA) 108 (90 Base) MCG/ACT inhaler Inhale 2 puffs into the lungs every 6 (six) hours as needed for wheezing or shortness of breath.    [provider]  gabapentin (NEURONTIN) 100 MG capsule Take 100 mg by mouth at bedtime as needed (neuropathy).     [provider]    Family History Family History  Problem Relation Age of Onset  . Hypertension Mother   . Bipolar disorder Mother   . Diabetes Father   . Gout Father   . Bipolar disorder Father     Social History Social History   Tobacco Use  . Smoking status: Current Every Day Smoker    Packs/day: 0.50    Types: Cigars, Cigarettes  . Smokeless tobacco: Never Used  Substance Use Topics  . Alcohol use: Yes     Comment: rare  . Drug use: Yes    Types: Marijuana     Allergies   Tylenol [acetaminophen]   Review of Systems Review of Systems All other systems negative except as documented in the HPI. All pertinent positives and negatives as reviewed in the HPI.  Physical Exam Updated Vital Signs BP 125/78 (BP Location: Right Arm)   Pulse 85   Temp 98.4 F (36.9 C) (Oral)   Resp 15   Wt 97.5 kg   SpO2 100%   BMI 26.87 kg/m   Physical Exam Vitals signs and nursing note reviewed.  Constitutional:      General: He is not in acute distress.    Appearance: He is well-developed.  HENT:     Head: Normocephalic and atraumatic.  Eyes:     Pupils: Pupils are equal, round, and reactive to light.  Pulmonary:     Effort: Pulmonary effort is normal.  Skin:    General: Skin is warm and dry.       Neurological:     Mental Status: He is alert and oriented to person, place, and time.      ED Treatments / Results  Labs (all labs ordered are listed, but only abnormal results are displayed) Labs Reviewed - No data to display  EKG None  Radiology No results found.  Procedures Procedures (  including critical care time)  Medications Ordered in ED Medications  lidocaine (XYLOCAINE) 2 % (with pres) injection 400 mg (has no administration in time range)     Initial Impression / Assessment and Plan / ED Course  I have reviewed the triage vital signs and the nursing notes.  Pertinent labs & imaging results that were available during my care of the patient were reviewed by me and considered in my medical decision making (see chart for details).       INCISION AND DRAINAGE Performed by: Jamesetta Orleanshristopher W Niles Ess Consent: Verbal consent obtained. Risks and benefits: risks, benefits and alternatives were discussed Type: abscess  Body area: Right lateral buttocks  Anesthesia: local infiltration  Incision was made with a scalpel.  Local anesthetic: lidocaine 2 % without  epinephrine  Anesthetic total: 5 ml  Complexity: complex Blunt dissection to break up loculations  Drainage: purulent  Drainage amount: Moderate  Packing material: 1/4 in iodoform gauze  Patient tolerance: Patient tolerated the procedure well with no immediate complications.   Had a large amount of pus that was drained from the area.  Patient does not have any surrounding cellulitis at this point.  Told the patient to keep the area clean and dry.  Patient agrees the plan and all questions were answered.  I told him to return for any worsening in his condition. Final Clinical Impressions(s) / ED Diagnoses   Final diagnoses:  None    ED Discharge Orders    None       Charlestine NightLawyer, Lazer Wollard, PA-C 07/06/19 1035    Milagros Lollykstra, Richard S, MD 07/06/19 [phone]

## 2019-07-06 NOTE — Discharge Instructions (Addendum)
Return here as needed.  Keep the area clean and dry.  You can use warm bath soaks as well.  Take 800 mg of ibuprofen every 6-8 hours for pain and you can use 1000 mg of Tylenol every 4 hours.

## 2019-08-03 ENCOUNTER — Emergency Department (HOSPITAL_BASED_OUTPATIENT_CLINIC_OR_DEPARTMENT_OTHER)
Admission: EM | Admit: 2019-08-03 | Discharge: 2019-08-03 | Disposition: A | Discharge status: Home / Self Care | Payer: No Typology Code available for payment source | Attending: Emergency Medicine | Admitting: Emergency Medicine

## 2019-08-03 ENCOUNTER — Other Ambulatory Visit: Payer: Self-pay

## 2019-08-03 ENCOUNTER — Encounter (HOSPITAL_BASED_OUTPATIENT_CLINIC_OR_DEPARTMENT_OTHER): Payer: Self-pay | Admitting: *Deleted

## 2019-08-03 DIAGNOSIS — F17210 Nicotine dependence, cigarettes, uncomplicated: Secondary | ICD-10-CM | POA: Diagnosis not present

## 2019-08-03 DIAGNOSIS — Y92410 Unspecified street and highway as the place of occurrence of the external cause: Secondary | ICD-10-CM | POA: Insufficient documentation

## 2019-08-03 DIAGNOSIS — S339XXA Sprain of unspecified parts of lumbar spine and pelvis, initial encounter: Secondary | ICD-10-CM | POA: Insufficient documentation

## 2019-08-03 DIAGNOSIS — Y999 Unspecified external cause status: Secondary | ICD-10-CM | POA: Diagnosis not present

## 2019-08-03 DIAGNOSIS — S39012A Strain of muscle, fascia and tendon of lower back, initial encounter: Secondary | ICD-10-CM

## 2019-08-03 DIAGNOSIS — S3992XA Unspecified injury of lower back, initial encounter: Secondary | ICD-10-CM | POA: Diagnosis present

## 2019-08-03 DIAGNOSIS — S8002XA Contusion of left knee, initial encounter: Secondary | ICD-10-CM | POA: Insufficient documentation

## 2019-08-03 DIAGNOSIS — Y93I9 Activity, other involving external motion: Secondary | ICD-10-CM | POA: Diagnosis not present

## 2019-08-03 MED ORDER — METHOCARBAMOL 500 MG PO TABS: 500.0000 mg | ORAL_TABLET | Freq: Three times a day (TID) | ORAL | 0 refills | Status: DC | PRN

## 2019-08-03 MED ORDER — IBUPROFEN 800 MG PO TABS
800.0000 mg | ORAL_TABLET | Freq: Once | ORAL | Status: AC
  Administered 2019-08-03: 12:00:00 800 mg via ORAL
  Filled 2019-08-03: qty 1
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
## 2019-08-05 ENCOUNTER — Encounter (HOSPITAL_BASED_OUTPATIENT_CLINIC_OR_DEPARTMENT_OTHER): Payer: Self-pay | Admitting: Emergency Medicine

## 2019-08-05 ENCOUNTER — Other Ambulatory Visit: Payer: Self-pay

## 2019-08-05 ENCOUNTER — Emergency Department (HOSPITAL_BASED_OUTPATIENT_CLINIC_OR_DEPARTMENT_OTHER): Payer: No Typology Code available for payment source

## 2019-08-05 ENCOUNTER — Emergency Department (HOSPITAL_BASED_OUTPATIENT_CLINIC_OR_DEPARTMENT_OTHER)
Admission: EM | Admit: 2019-08-05 | Discharge: 2019-08-05 | Disposition: A | Discharge status: Home / Self Care | Payer: No Typology Code available for payment source | Attending: Emergency Medicine | Admitting: Emergency Medicine

## 2019-08-05 DIAGNOSIS — F17210 Nicotine dependence, cigarettes, uncomplicated: Secondary | ICD-10-CM | POA: Diagnosis not present

## 2019-08-05 DIAGNOSIS — S39012D Strain of muscle, fascia and tendon of lower back, subsequent encounter: Secondary | ICD-10-CM | POA: Diagnosis not present

## 2019-08-05 DIAGNOSIS — S39012A Strain of muscle, fascia and tendon of lower back, initial encounter: Secondary | ICD-10-CM

## 2019-08-05 MED ORDER — DIAZEPAM 5 MG PO TABS: 5.0000 mg | ORAL_TABLET | Freq: Two times a day (BID) | ORAL | 0 refills | Status: DC

## 2019-08-05 MED ORDER — KETOROLAC TROMETHAMINE 30 MG/ML IJ SOLN
30.0000 mg | Freq: Once | INTRAMUSCULAR | Status: AC
  Administered 2019-08-05: 11:00:00 30 mg via INTRAMUSCULAR
  Filled 2019-08-05: qty 1
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
## 2019-08-08 ENCOUNTER — Emergency Department (HOSPITAL_BASED_OUTPATIENT_CLINIC_OR_DEPARTMENT_OTHER)
Admission: EM | Admit: 2019-08-08 | Discharge: 2019-08-08 | Disposition: A | Discharge status: Home / Self Care | Payer: No Typology Code available for payment source | Attending: Emergency Medicine | Admitting: Emergency Medicine

## 2019-08-08 ENCOUNTER — Telehealth: Payer: Self-pay

## 2019-08-08 ENCOUNTER — Encounter (HOSPITAL_BASED_OUTPATIENT_CLINIC_OR_DEPARTMENT_OTHER): Payer: Self-pay | Admitting: Emergency Medicine

## 2019-08-08 ENCOUNTER — Other Ambulatory Visit: Payer: Self-pay

## 2019-08-08 DIAGNOSIS — S39012D Strain of muscle, fascia and tendon of lower back, subsequent encounter: Secondary | ICD-10-CM | POA: Insufficient documentation

## 2019-08-08 DIAGNOSIS — F17210 Nicotine dependence, cigarettes, uncomplicated: Secondary | ICD-10-CM | POA: Insufficient documentation

## 2019-08-08 MED ORDER — DIAZEPAM 5 MG PO TABS: 5.0000 mg | ORAL_TABLET | Freq: Two times a day (BID) | ORAL | 0 refills | Status: DC | PRN
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
## 2019-08-21 ENCOUNTER — Encounter: Payer: Self-pay | Admitting: Orthopedic Surgery

## 2019-08-21 ENCOUNTER — Other Ambulatory Visit: Payer: Self-pay

## 2019-08-21 ENCOUNTER — Ambulatory Visit (INDEPENDENT_AMBULATORY_CARE_PROVIDER_SITE_OTHER): Payer: Self-pay | Admitting: Orthopedic Surgery

## 2019-08-21 DIAGNOSIS — M5441 Lumbago with sciatica, right side: Secondary | ICD-10-CM

## 2019-08-21 DIAGNOSIS — M5442 Lumbago with sciatica, left side: Secondary | ICD-10-CM

## 2019-08-21 MED ORDER — PREDNISONE 5 MG (21) PO TBPK: ORAL_TABLET | ORAL | 0 refills | Status: DC

## 2019-08-21 MED ORDER — METHOCARBAMOL 500 MG PO TABS: 500.0000 mg | ORAL_TABLET | Freq: Three times a day (TID) | ORAL | 0 refills | Status: DC | PRN
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
## 2019-08-27 ENCOUNTER — Other Ambulatory Visit: Payer: Self-pay

## 2019-08-27 ENCOUNTER — Encounter (HOSPITAL_BASED_OUTPATIENT_CLINIC_OR_DEPARTMENT_OTHER): Payer: Self-pay | Admitting: *Deleted

## 2019-08-27 ENCOUNTER — Emergency Department (HOSPITAL_BASED_OUTPATIENT_CLINIC_OR_DEPARTMENT_OTHER)
Admission: EM | Admit: 2019-08-27 | Discharge: 2019-08-27 | Disposition: A | Discharge status: Home / Self Care | Payer: Self-pay | Attending: Emergency Medicine | Admitting: Emergency Medicine

## 2019-08-27 DIAGNOSIS — Z0279 Encounter for issue of other medical certificate: Secondary | ICD-10-CM | POA: Insufficient documentation

## 2019-08-27 DIAGNOSIS — F17210 Nicotine dependence, cigarettes, uncomplicated: Secondary | ICD-10-CM | POA: Insufficient documentation

## 2019-08-27 DIAGNOSIS — R112 Nausea with vomiting, unspecified: Secondary | ICD-10-CM | POA: Insufficient documentation

## 2019-08-27 DIAGNOSIS — R51 Headache: Secondary | ICD-10-CM | POA: Insufficient documentation

## 2019-08-27 DIAGNOSIS — R519 Headache, unspecified: Secondary | ICD-10-CM

## 2019-08-27 MED ORDER — ONDANSETRON 4 MG PO TBDP: 4.0000 mg | ORAL_TABLET | Freq: Three times a day (TID) | ORAL | 0 refills | Status: DC | PRN

## 2019-08-27 NOTE — ED Triage Notes (Signed)
Pt states he drank "a lot" of tequila last night, woke up this am with headache and one episode of emesis, which made him feel better. Pt states he went to work "but I couldn't hang in there" so he needs a note for leaving work. Pt states he still feels slightly nauseous, but no further emesis. Denies any abd pain or other c/o.

## 2019-08-27 NOTE — Discharge Instructions (Signed)
You can take 1 to 2 tablets of Tylenol (350mg -1000mg  depending on the dose) every 6 hours as needed for headache or pain.  Do not exceed 4000 mg of Tylenol daily.   Take Zofran as needed for nausea or vomiting.  Let this medicine dissolve under your tongue and wait around 10 to 15 minutes before you have anything to eat or drink.  Drink plenty of fluids and get plenty of rest today.  I would recommend taking a few sips of water every 5 minutes for the next few hours to make sure that you stay hydrated.  Eat a diet of bland foods that will not upset your stomach.  Return to the emergency department if any concerning signs or symptoms develop such as severe headache, vision changes, severe abdominal pain, persistent vomiting

## 2019-08-27 NOTE — ED Provider Notes (Signed)
[facility] Provider Note   CSN: [id number] Arrival date & time: 08/27/19  1151     History   Chief Complaint Chief Complaint  Patient presents with  . drank alot of tequila last night, nausea    HPI Dennis Simmons is a 23 y.o. male with history of tobacco abuse, ADHD, bronchitis presenting requesting a work note.  He reports developing nausea and vomiting this morning after drinking 6 or 7 shots of tequila last night.  He did awake with a very mild throbbing frontal headache which he states he is not concerned about.  He states his "stomach is bubbly "but denies any abdominal pain.  He was able to drink water, Pepsi, and eat crackers and tolerated this without difficulty.  Emesis was nonbloody and nonbilious.  No aggravating or alleviating factors noted.  Denies chest pain, shortness of breath, vision changes, numbness, weakness, urinary symptoms.  He reports that he attempted to go to work today but they sent him home because he appeared unwell and he is "only here for a work note".     The history is provided by the patient.    Past Medical History:  Diagnosis Date  . Bronchitis     Patient Active Problem List   Diagnosis Date Noted  . Rhabdomyolysis 09/06/2018  . Dehydration 09/06/2018  . AKI (acute kidney injury) ([place]) 09/06/2018  . Tobacco abuse 09/06/2018  . Leukocytosis 09/06/2018  . ADHD 09/06/2018  . Bacteriuria with pyuria 09/06/2018    Past Surgical History:  Procedure Laterality Date  . APPENDECTOMY          Home Medications    Prior to Admission medications   Medication Sig Start Date End Date Taking? Authorizing Provider  albuterol (PROVENTIL HFA;VENTOLIN HFA) 108 (90 Base) MCG/ACT inhaler Inhale 2 puffs into the lungs every 6 (six) hours as needed for wheezing or shortness of breath.    [provider]  diazepam (VALIUM) 5 MG tablet Take 1 tablet (5 mg total) by mouth every 12 (twelve) hours as needed for  muscle spasms. 08/08/19   Julianne Rice, MD  gabapentin (NEURONTIN) 100 MG capsule Take 100 mg by mouth at bedtime as needed (neuropathy).     [provider]  methocarbamol (ROBAXIN) 500 MG tablet Take 1 tablet (500 mg total) by mouth every 8 (eight) hours as needed for muscle spasms. 08/03/19   Hayden Rasmussen, MD  methocarbamol (ROBAXIN) 500 MG tablet Take 1 tablet (500 mg total) by mouth every 8 (eight) hours as needed for muscle spasms. 08/21/19   Meredith Pel, MD  ondansetron (ZOFRAN ODT) 4 MG disintegrating tablet Take 1 tablet (4 mg total) by mouth every 8 (eight) hours as needed for nausea or vomiting. 08/27/19   Nils Flack, Asencion Loveday A, PA-C  predniSONE (STERAPRED UNI-PAK 21 TAB) 5 MG (21) TBPK tablet Take dosepak as directed 08/21/19   Meredith Pel, MD    Family History Family History  Problem Relation Age of Onset  . Hypertension Mother   . Bipolar disorder Mother   . Diabetes Father   . Gout Father   . Bipolar disorder Father     Social History Social History   Tobacco Use  . Smoking status: Current Every Day Smoker    Packs/day: 0.50    Types: Cigars, Cigarettes  . Smokeless tobacco: Never Used  Substance Use Topics  . Alcohol use: Yes    Comment: rare  . Drug use: Yes  Types: Marijuana     Allergies   Tylenol [acetaminophen]   Review of Systems Review of Systems  Constitutional: Negative for chills and fever.  Eyes: Negative for visual disturbance.  Respiratory: Negative for shortness of breath.   Cardiovascular: Negative for chest pain.  Gastrointestinal: Positive for nausea and vomiting. Negative for abdominal pain, constipation and diarrhea.  Genitourinary: Negative for dysuria and hematuria.  Neurological: Positive for headaches.  All other systems reviewed and are negative.    Physical Exam Updated Vital Signs BP 129/88 (BP Location: Right Arm)   Pulse 84   Temp 98.4 F (36.9 C) (Oral)   Resp 18   Ht  (1.93 m)   Wt 86.2  kg   SpO2 99%   BMI 23.13 kg/m   Physical Exam Vitals signs and nursing note reviewed.  Constitutional:      General: He is not in acute distress.    Appearance: He is well-developed.     Comments: Resting comfortably in bed, pleasant appearance  HENT:     Head: Normocephalic and atraumatic.     Mouth/Throat:     Mouth: Mucous membranes are moist.  Eyes:     General:        Right eye: No discharge.        Left eye: No discharge.     Conjunctiva/sclera: Conjunctivae normal.  Neck:     Musculoskeletal: Normal range of motion and neck supple. No neck rigidity.     Vascular: No JVD.     Trachea: No tracheal deviation.  Cardiovascular:     Rate and Rhythm: Normal rate and regular rhythm.  Pulmonary:     Effort: Pulmonary effort is normal.     Breath sounds: Normal breath sounds.  Abdominal:     General: Abdomen is flat. Bowel sounds are normal. There is no distension.     Palpations: Abdomen is soft.     Tenderness: There is no abdominal tenderness. There is no right CVA tenderness, left CVA tenderness, guarding or rebound.  Skin:    General: Skin is warm and dry.     Findings: No erythema.  Neurological:     General: No focal deficit present.     Mental Status: He is alert.     Comments: Fluent speech, no facial droop, cranial nerves appear grossly intact.  Moving all extremities spontaneously without difficulty.  Coordination intact.  Psychiatric:        Behavior: Behavior normal.      ED Treatments / Results  Labs (all labs ordered are listed, but only abnormal results are displayed) Labs Reviewed - No data to display  EKG None  Radiology No results found.  Procedures Procedures (including critical care time)  Medications Ordered in ED Medications - No data to display   Initial Impression / Assessment and Plan / ED Course  I have reviewed the triage vital signs and the nursing notes.  Pertinent labs & imaging results that were available during my care  of the patient were reviewed by me and considered in my medical decision making (see chart for details).        Patient presenting requesting work note for nausea vomiting after alcohol consumption last night.  I suspect he is hung over.  He is afebrile, vital signs are stable, nontoxic in appearance.  Normal neurologic examination and no red flag signs concerning for SAH, ICH, meningitis, CVA.  Abdomen is soft and nontender, doubt acute surgical abdominal pathology, pancreatitis, obstruction, perforation.  Will give a prescription for Zofran to manage nausea and vomiting but he has been able to tolerate p.o. food and fluids at home without difficulty.  Discussed strict ED return precautions. Patient verbalized understanding of and agreement with plan and is safe for discharge home at this time.   Final Clinical Impressions(s) / ED Diagnoses   Final diagnoses:  Non-intractable vomiting with nausea, unspecified vomiting type  Mild headache    ED Discharge Orders         Ordered    ondansetron (ZOFRAN ODT) 4 MG disintegrating tablet  Every 8 hours PRN     08/27/19 1326           Jeanie Sewer, PA-C 08/27/19 1327    Terrilee Files, MD 08/28/19 1706

## 2019-09-11 ENCOUNTER — Ambulatory Visit (INDEPENDENT_AMBULATORY_CARE_PROVIDER_SITE_OTHER): Payer: Self-pay | Admitting: Orthopedic Surgery

## 2019-09-11 ENCOUNTER — Encounter: Payer: Self-pay | Admitting: Orthopedic Surgery

## 2019-09-11 VITALS — Ht 75.0 in | Wt 185.0 lb

## 2019-09-11 DIAGNOSIS — S39012D Strain of muscle, fascia and tendon of lower back, subsequent encounter: Secondary | ICD-10-CM

## 2019-09-11 DIAGNOSIS — M25562 Pain in left knee: Secondary | ICD-10-CM

## 2019-09-11 MED ORDER — CYCLOBENZAPRINE HCL 10 MG PO TABS: 10.0000 mg | ORAL_TABLET | Freq: Every day | ORAL | 0 refills | Status: DC | PRN

## 2019-09-11 MED ORDER — MELOXICAM 15 MG PO TABS: 15.0000 mg | ORAL_TABLET | Freq: Every day | ORAL | 0 refills | Status: DC | PRN
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
## 2019-11-08 ENCOUNTER — Encounter (HOSPITAL_BASED_OUTPATIENT_CLINIC_OR_DEPARTMENT_OTHER): Payer: Self-pay | Admitting: Emergency Medicine

## 2019-11-08 ENCOUNTER — Emergency Department (HOSPITAL_BASED_OUTPATIENT_CLINIC_OR_DEPARTMENT_OTHER)
Admission: EM | Admit: 2019-11-08 | Discharge: 2019-11-08 | Disposition: A | Discharge status: Home / Self Care | Payer: Self-pay | Attending: Emergency Medicine | Admitting: Emergency Medicine

## 2019-11-08 ENCOUNTER — Other Ambulatory Visit: Payer: Self-pay

## 2019-11-08 DIAGNOSIS — Z79899 Other long term (current) drug therapy: Secondary | ICD-10-CM | POA: Insufficient documentation

## 2019-11-08 DIAGNOSIS — K047 Periapical abscess without sinus: Secondary | ICD-10-CM | POA: Insufficient documentation

## 2019-11-08 DIAGNOSIS — Z23 Encounter for immunization: Secondary | ICD-10-CM | POA: Insufficient documentation

## 2019-11-08 DIAGNOSIS — K029 Dental caries, unspecified: Secondary | ICD-10-CM | POA: Insufficient documentation

## 2019-11-08 DIAGNOSIS — F17210 Nicotine dependence, cigarettes, uncomplicated: Secondary | ICD-10-CM | POA: Insufficient documentation

## 2019-11-08 MED ORDER — LIDOCAINE HCL (PF) 1 % IJ SOLN
INTRAMUSCULAR | Status: AC
  Administered 2019-11-08: 5 mL
  Filled 2019-11-08: qty 5

## 2019-11-08 MED ORDER — NAPROXEN 500 MG PO TABS: 500.0000 mg | ORAL_TABLET | Freq: Two times a day (BID) | ORAL | 0 refills | Status: AC

## 2019-11-08 MED ORDER — AMOXICILLIN 500 MG PO CAPS: 500.0000 mg | ORAL_CAPSULE | Freq: Three times a day (TID) | ORAL | 0 refills | Status: AC

## 2019-11-08 MED ORDER — TETANUS-DIPHTH-ACELL PERTUSSIS 5-2.5-18.5 LF-MCG/0.5 IM SUSP
0.5000 mL | Freq: Once | INTRAMUSCULAR | Status: AC
  Administered 2019-11-08: 0.5 mL via INTRAMUSCULAR
  Filled 2019-11-08: qty 0.5

## 2019-11-08 MED ORDER — AMOXICILLIN 500 MG PO CAPS
500.0000 mg | ORAL_CAPSULE | Freq: Once | ORAL | Status: AC
  Administered 2019-11-08: 500 mg via ORAL
  Filled 2019-11-08: qty 1

## 2019-11-08 NOTE — Discharge Instructions (Signed)
Take the antibiotics and anti-inflammatories as prescribed.  This should get better over time.  Please follow-up with dentistry.  You are provided with resources.  Return if you are unable to open your mouth, tolerate liquids or unable to swallow your saliva.

## 2019-11-08 NOTE — ED Provider Notes (Signed)
[facility] Provider Note   CSN: [id number] Arrival date & time: 11/08/19  1413     History   Chief Complaint Chief Complaint  Patient presents with  . Dental Pain    HPI Dennis Simmons is a 23 y.o. male with past medical history significant for tobacco abuse, ADHD who presents for evaluation of dental pain.  Patient states he has had dental pain to his left lower dentition x1 week.  He is not followed by dentistry.  He has been able to tolerate p.o. intake without difficulty.  Denies fever, chills, nausea, vomiting, facial swelling, redness, warmth, drooling, dysphagia, trismus, neck pain, neck stiffness, chest pain, shortness of breath.  He has not take anything for symptoms.  He rates his current pain a 3/10.  Described as aching.  Denies additional aggravating or alleviating factors.  History obtained from patient and past medical records.  No interpreter is used.     HPI  Past Medical History:  Diagnosis Date  . Bronchitis     Patient Active Problem List   Diagnosis Date Noted  . Rhabdomyolysis 09/06/2018  . Dehydration 09/06/2018  . AKI (acute kidney injury) ([place]) 09/06/2018  . Tobacco abuse 09/06/2018  . Leukocytosis 09/06/2018  . ADHD 09/06/2018  . Bacteriuria with pyuria 09/06/2018    Past Surgical History:  Procedure Laterality Date  . APPENDECTOMY          Home Medications    Prior to Admission medications   Medication Sig Start Date End Date Taking? Authorizing Provider  albuterol (PROVENTIL HFA;VENTOLIN HFA) 108 (90 Base) MCG/ACT inhaler Inhale 2 puffs into the lungs every 6 (six) hours as needed for wheezing or shortness of breath.    [provider]  amoxicillin (AMOXIL) 500 MG capsule Take 1 capsule (500 mg total) by mouth 3 (three) times daily for 5 days. 11/08/19 11/13/19  Henderly, Britni A, PA-C  cyclobenzaprine (FLEXERIL) 10 MG tablet Take 1 tablet (10 mg total) by mouth daily as needed for muscle  spasms. 09/11/19   Magnant, [name] L, PA-C  diazepam (VALIUM) 5 MG tablet Take 1 tablet (5 mg total) by mouth every 12 (twelve) hours as needed for muscle spasms. Patient not taking: Reported on 09/11/2019 08/08/19   Julianne Rice, MD  gabapentin (NEURONTIN) 100 MG capsule Take 100 mg by mouth at bedtime as needed (neuropathy).     [provider]  meloxicam (MOBIC) 15 MG tablet Take 1 tablet (15 mg total) by mouth daily as needed for pain. 09/11/19   Magnant, [name] L, PA-C  methocarbamol (ROBAXIN) 500 MG tablet Take 1 tablet (500 mg total) by mouth every 8 (eight) hours as needed for muscle spasms. Patient not taking: Reported on 09/11/2019 08/03/19   Hayden Rasmussen, MD  methocarbamol (ROBAXIN) 500 MG tablet Take 1 tablet (500 mg total) by mouth every 8 (eight) hours as needed for muscle spasms. 08/21/19   Meredith Pel, MD  naproxen (NAPROSYN) 500 MG tablet Take 1 tablet (500 mg total) by mouth 2 (two) times daily. 11/08/19   Henderly, Britni A, PA-C  ondansetron (ZOFRAN ODT) 4 MG disintegrating tablet Take 1 tablet (4 mg total) by mouth every 8 (eight) hours as needed for nausea or vomiting. 08/27/19   Nils Flack, Mina A, PA-C  predniSONE (STERAPRED UNI-PAK 21 TAB) 5 MG (21) TBPK tablet Take dosepak as directed Patient not taking: Reported on 09/11/2019 08/21/19   Meredith Pel, MD    Family History Family History  Problem Relation Age of Onset  . Hypertension Mother   . Bipolar disorder Mother   . Diabetes Father   . Gout Father   . Bipolar disorder Father     Social History Social History   Tobacco Use  . Smoking status: Current Every Day Smoker    Packs/day: 0.50    Types: Cigars, Cigarettes  . Smokeless tobacco: Never Used  Substance Use Topics  . Alcohol use: Yes    Comment: rare  . Drug use: Yes    Types: Marijuana     Allergies   Tylenol [acetaminophen]   Review of Systems Review of Systems  Constitutional: Negative.   HENT: Positive for dental  problem. Negative for congestion, ear pain, facial swelling, mouth sores, nosebleeds, postnasal drip, sinus pressure, sinus pain, sore throat, trouble swallowing and voice change.   Eyes: Negative.   Respiratory: Negative.   Cardiovascular: Negative.   Gastrointestinal: Negative for nausea and vomiting.  Genitourinary: Negative.   Musculoskeletal: Negative for neck pain and neck stiffness.  Skin: Negative.   Neurological: Negative.   All other systems reviewed and are negative.    Physical Exam Updated Vital Signs BP (!) 138/91 (BP Location: Right Arm)   Pulse 61   Temp 98.9 F (37.2 C) (Oral)   Resp 18   Ht  (1.905 m)   Wt 83 kg   SpO2 100%   BMI 22.87 kg/m   Physical Exam Vitals signs and nursing note reviewed.  Constitutional:      General: He is not in acute distress.    Appearance: He is well-developed. He is not ill-appearing, toxic-appearing or diaphoretic.  HENT:     Head: Normocephalic and atraumatic.     Jaw: There is normal jaw occlusion.     Comments: No facial swelling.  No submandibular swelling, Brawning, no facial fluctuance or induration.    Nose: Nose normal.     Mouth/Throat:     Lips: Pink.     Mouth: Mucous membranes are moist.     Dentition: Abnormal dentition. Does not have dentures. Dental tenderness, gingival swelling, dental caries and dental abscesses present. No gum lesions.     Tongue: No lesions. Tongue does not deviate from midline.     Pharynx: Oropharynx is clear. Uvula midline.     Tonsils: No tonsillar exudate or tonsillar abscesses.      Comments: Patient with multiple dental caries.  He has tenderness palpation to his left posterior lower dentition.  He does have some gingival erythema and swelling.  Consistent with periapical abscess.  Sublingual area soft.  Tongue midline without deviation.  Posterior oropharynx clear.  Mucous membranes moist.  No drooling, dysphagia or trismus. Eyes:     Pupils: Pupils are equal, round, and  reactive to light.  Neck:     Musculoskeletal: Full passive range of motion without pain, normal range of motion and neck supple.     Trachea: Phonation normal.     Comments: No neck stiffness or neck rigidity.  Phonation normal. Cardiovascular:     Rate and Rhythm: Normal rate and regular rhythm.  Pulmonary:     Effort: Pulmonary effort is normal. No respiratory distress.  Abdominal:     General: There is no distension.     Palpations: Abdomen is soft.  Musculoskeletal: Normal range of motion.  Lymphadenopathy:     Cervical: No cervical adenopathy.  Skin:    General: Skin is warm and dry.  Neurological:     Mental  Status: He is alert.      ED Treatments / Results  Labs (all labs ordered are listed, but only abnormal results are displayed) Labs Reviewed - No data to display  EKG None  Radiology No results found.  Procedures .Marland Kitchen.Incision and Drainage  Date/Time: 11/08/2019 3:30 PM Performed by: Linwood DibblesHenderly, Britni A, PA-C Authorized by: Linwood DibblesHenderly, Britni A, PA-C   Consent:    Consent obtained:  Verbal   Consent given by:  Patient   Risks discussed:  Bleeding, incomplete drainage, pain and damage to other organs   Alternatives discussed:  No treatment, delayed treatment, alternative treatment, observation and referral Universal protocol:    Procedure explained and questions answered to patient or proxy's satisfaction: yes     Relevant documents present and verified: yes     Test results available and properly labeled: yes     Imaging studies available: yes     Required blood products, implants, devices, and special equipment available: yes     Site/side marked: yes     Immediately prior to procedure a time out was called: yes     Patient identity confirmed:  Verbally with patient Location:    Type:  Abscess   Size:  1cm   Location:  Mouth   Mouth location: periapical. Anesthesia (see MAR for exact dosages):    Anesthesia method:  Local infiltration   Local  anesthetic:  Lidocaine 1% w/o epi Procedure type:    Complexity:  Simple Procedure details:    Incision types:  Single straight   Incision depth:  Subcutaneous   Scalpel blade:  11   Wound management:  Probed and deloculated, irrigated with saline and extensive cleaning   Drainage:  Purulent   Drainage amount:  Moderate   Wound treatment:  Wound left open Post-procedure details:    Patient tolerance of procedure:  Tolerated well, no immediate complications   (including critical care time)  Medications Ordered in ED Medications  lidocaine (PF) (XYLOCAINE) 1 % injection (has no administration in time range)  Tdap (BOOSTRIX) injection 0.5 mL (0.5 mLs Intramuscular Given 11/08/19 1446)  amoxicillin (AMOXIL) capsule 500 mg (500 mg Oral Given 11/08/19 1445)     Initial Impression / Assessment and Plan / ED Course  I have reviewed the triage vital signs and the nursing notes.  Pertinent labs & imaging results that were available during my care of the patient were reviewed by me and considered in my medical decision making (see chart for details).  23 year old male appears otherwise well presents for evaluation of left lower dental pain x1 week.  He is afebrile, nonseptic, non-ill-appearing.  Not followed by dentistry.  No facial swelling, submandibular area soft.  No evidence of deep space infection or Ludwig's angina.  Sublingual area soft.  Tongue midline.  No evidence of PTA or RPA.  No drooling, dysphagia or trismus.  He does have erythema and swelling to his left posterior lower dentition consistent with periapical abscess. This was drained in the ED.  See procedure note. Moderate purulent drainage.  Minimal bleeding.  Will send home on antibiotics and have him follow with dentistry. Given resources.  Tolerating p.o. intake without difficulty.  The patient has been appropriately medically screened and/or stabilized in the ED. I have low suspicion for any other emergent medical condition  which would require further screening, evaluation or treatment in the ED or require inpatient management.       Final Clinical Impressions(s) / ED Diagnoses   Final diagnoses:  Pain due to dental caries  Dental abscess    ED Discharge Orders         Ordered    amoxicillin (AMOXIL) 500 MG capsule  3 times daily     11/08/19 1514    naproxen (NAPROSYN) 500 MG tablet  2 times daily     11/08/19 1514           Henderly, Britni A, PA-C 11/08/19 1532    Linwood Dibbles, MD 11/10/19 1433

## 2019-11-08 NOTE — ED Triage Notes (Signed)
L lower dental pain x 1 week.  

## 2019-11-08 NOTE — ED Notes (Signed)
ED Provider at bedside. 

## 2020-10-18 ENCOUNTER — Ambulatory Visit: Payer: BC Managed Care – PPO | Admitting: Podiatry

## 2020-10-25 ENCOUNTER — Ambulatory Visit: Payer: BC Managed Care – PPO | Admitting: Podiatry

## 2020-11-08 ENCOUNTER — Ambulatory Visit: Payer: BC Managed Care – PPO | Admitting: Podiatry

## 2021-12-18 ENCOUNTER — Telehealth: Payer: Self-pay | Admitting: Physician Assistant

## 2021-12-18 ENCOUNTER — Encounter: Payer: Self-pay | Admitting: Physician Assistant

## 2021-12-18 DIAGNOSIS — B349 Viral infection, unspecified: Secondary | ICD-10-CM

## 2021-12-18 MED ORDER — ONDANSETRON 4 MG PO TBDP: 4.0000 mg | ORAL_TABLET | Freq: Three times a day (TID) | ORAL | 0 refills | Status: DC | PRN

## 2021-12-18 MED ORDER — IPRATROPIUM BROMIDE 0.03 % NA SOLN: 2.0000 | Freq: Two times a day (BID) | NASAL | 0 refills | Status: DC

## 2021-12-18 MED ORDER — BENZONATATE 100 MG PO CAPS: 100.0000 mg | ORAL_CAPSULE | Freq: Three times a day (TID) | ORAL | 0 refills | Status: DC | PRN

## 2021-12-18 NOTE — Patient Instructions (Signed)
Kreston P Haupert, thank you for joining Mar Daring, PA-C for today's virtual visit.  While this provider is not your primary care provider (PCP), if your PCP is located in our provider database this encounter information will be shared with them immediately following your visit.  Consent: (Patient) Dennis Simmons provided verbal consent for this virtual visit at the beginning of the encounter.  Current Medications:  Current Outpatient Medications:    benzonatate (TESSALON) 100 MG capsule, Take 1 capsule (100 mg total) by mouth 3 (three) times daily as needed., Disp: 30 capsule, Rfl: 0   ipratropium (ATROVENT) 0.03 % nasal spray, Place 2 sprays into both nostrils every 12 (twelve) hours., Disp: 30 mL, Rfl: 0   ondansetron (ZOFRAN-ODT) 4 MG disintegrating tablet, Take 1 tablet (4 mg total) by mouth every 8 (eight) hours as needed for nausea or vomiting., Disp: 20 tablet, Rfl: 0   albuterol (PROVENTIL HFA;VENTOLIN HFA) 108 (90 Base) MCG/ACT inhaler, Inhale 2 puffs into the lungs every 6 (six) hours as needed for wheezing or shortness of breath., Disp: , Rfl:    cyclobenzaprine (FLEXERIL) 10 MG tablet, Take 1 tablet (10 mg total) by mouth daily as needed for muscle spasms., Disp: 30 tablet, Rfl: 0   diazepam (VALIUM) 5 MG tablet, Take 1 tablet (5 mg total) by mouth every 12 (twelve) hours as needed for muscle spasms. (Patient not taking: Reported on 09/11/2019), Disp: 10 tablet, Rfl: 0   gabapentin (NEURONTIN) 100 MG capsule, Take 100 mg by mouth at bedtime as needed (neuropathy). , Disp: , Rfl:    meloxicam (MOBIC) 15 MG tablet, Take 1 tablet (15 mg total) by mouth daily as needed for pain., Disp: 30 tablet, Rfl: 0   methocarbamol (ROBAXIN) 500 MG tablet, Take 1 tablet (500 mg total) by mouth every 8 (eight) hours as needed for muscle spasms. (Patient not taking: Reported on 09/11/2019), Disp: 20 tablet, Rfl: 0   methocarbamol (ROBAXIN) 500 MG tablet, Take 1 tablet (500 mg total) by mouth every  8 (eight) hours as needed for muscle spasms., Disp: 30 tablet, Rfl: 0   naproxen (NAPROSYN) 500 MG tablet, Take 1 tablet (500 mg total) by mouth 2 (two) times daily., Disp: 30 tablet, Rfl: 0   predniSONE (STERAPRED UNI-PAK 21 TAB) 5 MG (21) TBPK tablet, Take dosepak as directed (Patient not taking: Reported on 09/11/2019), Disp: 21 tablet, Rfl: 0   Medications ordered in this encounter:  Meds ordered this encounter  Medications   ondansetron (ZOFRAN-ODT) 4 MG disintegrating tablet    Sig: Take 1 tablet (4 mg total) by mouth every 8 (eight) hours as needed for nausea or vomiting.    Dispense:  20 tablet    Refill:  0    Order Specific Question:   Supervising Provider    Answer:   MILLER, BRIAN [[id number]]   benzonatate (TESSALON) 100 MG capsule    Sig: Take 1 capsule (100 mg total) by mouth 3 (three) times daily as needed.    Dispense:  30 capsule    Refill:  0    Order Specific Question:   Supervising Provider    Answer:   MILLER, BRIAN [[id number]]   ipratropium (ATROVENT) 0.03 % nasal spray    Sig: Place 2 sprays into both nostrils every 12 (twelve) hours.    Dispense:  30 mL    Refill:  0    Order Specific Question:   Supervising Provider    Answer:   Noemi Chapel [[id number]]     *  If you need refills on other medications prior to your next appointment, please contact your pharmacy*  Follow-Up: Call back or seek an in-person evaluation if the symptoms worsen or if the condition fails to improve as anticipated.  Other Instructions COVID-19: Quarantine and Isolation Quarantine If you were exposed Quarantine and stay away from others when you have been in close contact with someone who has COVID-19. Isolate If you are sick or test positive Isolate when you are sick or when you have COVID-19, even if you don't have symptoms. When to stay home Calculating quarantine The date of your exposure is considered day 0. Day 1 is the first full day after your last contact with a person who has had  COVID-19. Stay home and away from other people for at least 5 days. Learn why CDC updated guidance for the general public. IF YOU were exposed to COVID-19 and are NOT  up to dateIF YOU were exposed to COVID-19 and are NOT on COVID-19 vaccinations Quarantine for at least 5 days Stay home Stay home and quarantine for at least 5 full days. Wear a well-fitting mask if you must be around others in your home. Do not travel. Get tested Even if you don't develop symptoms, get tested at least 5 days after you last had close contact with someone with COVID-19. After quarantine Watch for symptoms Watch for symptoms until 10 days after you last had close contact with someone with COVID-19. Avoid travel It is best to avoid travel until a full 10 days after you last had close contact with someone with COVID-19. If you develop symptoms Isolate immediately and get tested. Continue to stay home until you know the results. Wear a well-fitting mask around others. Take precautions until day 10 Wear a well-fitting mask Wear a well-fitting mask for 10 full days any time you are around others inside your home or in public. Do not go to places where you are unable to wear a well-fitting mask. If you must travel during days 6-10, take precautions. Avoid being around people who are more likely to get very sick from COVID-19. IF YOU were exposed to COVID-19 and are  up to dateIF YOU were exposed to COVID-19 and are on COVID-19 vaccinations No quarantine You do not need to stay home unless you develop symptoms. Get tested Even if you don't develop symptoms, get tested at least 5 days after you last had close contact with someone with COVID-19. Watch for symptoms Watch for symptoms until 10 days after you last had close contact with someone with COVID-19. If you develop symptoms Isolate immediately and get tested. Continue to stay home until you know the results. Wear a well-fitting mask around others. Take  precautions until day 10 Wear a well-fitting mask Wear a well-fitting mask for 10 full days any time you are around others inside your home or in public. Do not go to places where you are unable to wear a well-fitting mask. Take precautions if traveling Avoid being around people who are more likely to get very sick from COVID-19. IF YOU were exposed to COVID-19 and had confirmed COVID-19 within the past 90 days (you tested positive using a viral test) No quarantine You do not need to stay home unless you develop symptoms. Watch for symptoms Watch for symptoms until 10 days after you last had close contact with someone with COVID-19. If you develop symptoms Isolate immediately and get tested. Continue to stay home until you know the results. Wear  a well-fitting mask around others. Take precautions until day 10 Wear a well-fitting mask Wear a well-fitting mask for 10 full days any time you are around others inside your home or in public. Do not go to places where you are unable to wear a well-fitting mask. Take precautions if traveling Avoid being around people who are more likely to get very sick from COVID-19. Calculating isolation Day 0 is your first day of symptoms or a positive viral test. Day 1 is the first full day after your symptoms developed or your test specimen was collected. If you have COVID-19 or have symptoms, isolate for at least 5 days. IF YOU tested positive for COVID-19 or have symptoms, regardless of vaccination status Stay home for at least 5 days Stay home for 5 days and isolate from others in your home. Wear a well-fitting mask if you must be around others in your home. Do not travel. Ending isolation if you had symptoms End isolation after 5 full days if you are fever-free for 24 hours (without the use of fever-reducing medication) and your symptoms are improving. Ending isolation if you did NOT have symptoms End isolation after at least 5 full days after your  positive test. If you got very sick from COVID-19 or have a weakened immune system You should isolate for at least 10 days. Consult your doctor before ending isolation. Take precautions until day 10 Wear a well-fitting mask Wear a well-fitting mask for 10 full days any time you are around others inside your home or in public. Do not go to places where you are unable to wear a well-fitting mask. Do not travel Do not travel until a full 10 days after your symptoms started or the date your positive test was taken if you had no symptoms. Avoid being around people who are more likely to get very sick from COVID-19. Definitions Exposure Contact with someone infected with SARS-CoV-2, the virus that causes COVID-19, in a way that increases the likelihood of getting infected with the virus. Close contact A close contact is someone who was less than 6 feet away from an infected person (laboratory-confirmed or a clinical diagnosis) for a cumulative total of 15 minutes or more over a 24-hour period. For example, three individual 5-minute exposures for a total of 15 minutes. People who are exposed to someone with COVID-19 after they completed at least 5 days of isolation are not considered close contacts. Julio Sicks is a strategy used to prevent transmission of COVID-19 by keeping people who have been in close contact with someone with COVID-19 apart from others. Who does not need to quarantine? If you had close contact with someone with COVID-19 and you are in one of the following groups, you do not need to quarantine. You are up to date with your COVID-19 vaccines. You had confirmed COVID-19 within the last 90 days (meaning you tested positive using a viral test). If you are up to date with COVID-19 vaccines, you should wear a well-fitting mask around others for 10 days from the date of your last close contact with someone with COVID-19 (the date of last close contact is considered day 0). Get  tested at least 5 days after you last had close contact with someone with COVID-19. If you test positive or develop COVID-19 symptoms, isolate from other people and follow recommendations in the Isolation section below. If you tested positive for COVID-19 with a viral test within the previous 90 days and subsequently recovered and remain  without COVID-19 symptoms, you do not need to quarantine or get tested after close contact. You should wear a well-fitting mask around others for 10 days from the date of your last close contact with someone with COVID-19 (the date of last close contact is considered day 0). If you have COVID-19 symptoms, get tested and isolate from other people and follow recommendations in the Isolation section below. Who should quarantine? If you come into close contact with someone with COVID-19, you should quarantine if you are not up to date on COVID-19 vaccines. This includes people who are not vaccinated. What to do for quarantine Stay home and away from other people for at least 5 days (day 0 through day 5) after your last contact with a person who has COVID-19. The date of your exposure is considered day 0. Wear a well-fitting mask when around others at home, if possible. For 10 days after your last close contact with someone with COVID-19, watch for fever (100.47F or greater), cough, shortness of breath, or other COVID-19 symptoms. If you develop symptoms, get tested immediately and isolate until you receive your test results. If you test positive, follow isolation recommendations. If you do not develop symptoms, get tested at least 5 days after you last had close contact with someone with COVID-19. If you test negative, you can leave your home, but continue to wear a well-fitting mask when around others at home and in public until 10 days after your last close contact with someone with COVID-19. If you test positive, you should isolate for at least 5 days from the date of your  positive test (if you do not have symptoms). If you do develop COVID-19 symptoms, isolate for at least 5 days from the date your symptoms began (the date the symptoms started is day 0). Follow recommendations in the isolation section below. If you are unable to get a test 5 days after last close contact with someone with COVID-19, you can leave your home after day 5 if you have been without COVID-19 symptoms throughout the 5-day period. Wear a well-fitting mask for 10 days after your date of last close contact when around others at home and in public. Avoid people who are have weakened immune systems or are more likely to get very sick from COVID-19, and nursing homes and other high-risk settings, until after at least 10 days. If possible, stay away from people you live with, especially people who are at higher risk for getting very sick from COVID-19, as well as others outside your home throughout the full 10 days after your last close contact with someone with COVID-19. If you are unable to quarantine, you should wear a well-fitting mask for 10 days when around others at home and in public. If you are unable to wear a mask when around others, you should continue to quarantine for 10 days. Avoid people who have weakened immune systems or are more likely to get very sick from COVID-19, and nursing homes and other high-risk settings, until after at least 10 days. See additional information about travel. Do not go to places where you are unable to wear a mask, such as restaurants and some gyms, and avoid eating around others at home and at work until after 10 days after your last close contact with someone with COVID-19. After quarantine Watch for symptoms until 10 days after your last close contact with someone with COVID-19. If you have symptoms, isolate immediately and get tested. Quarantine in high-risk congregate  settings In certain congregate settings that have high risk of secondary transmission  (such as correctional and detention facilities, homeless shelters, or cruise ships), CDC recommends a 10-day quarantine for residents, regardless of vaccination and booster status. During periods of critical staffing shortages, facilities may consider shortening the quarantine period for staff to ensure continuity of operations. Decisions to shorten quarantine in these settings should be made in consultation with state, local, tribal, or territorial health departments and should take into consideration the context and characteristics of the facility. CDC's setting-specific guidance provides additional recommendations for these settings. Isolation Isolation is used to separate people with confirmed or suspected COVID-19 from those without COVID-19. People who are in isolation should stay home until it's safe for them to be around others. At home, anyone sick or infected should separate from others, or wear a well-fitting mask when they need to be around others. People in isolation should stay in a specific "sick room" or area and use a separate bathroom if available. Everyone who has presumed or confirmed COVID-19 should stay home and isolate from other people for at least 5 full days (day 0 is the first day of symptoms or the date of the day of the positive viral test for asymptomatic persons). They should wear a mask when around others at home and in public for an additional 5 days. People who are confirmed to have COVID-19 or are showing symptoms of COVID-19 need to isolate regardless of their vaccination status. This includes: People who have a positive viral test for COVID-19, regardless of whether or not they have symptoms. People with symptoms of COVID-19, including people who are awaiting test results or have not been tested. People with symptoms should isolate even if they do not know if they have been in close contact with someone with COVID-19. What to do for isolation Monitor your symptoms. If you  have an emergency warning sign (including trouble breathing), seek emergency medical care immediately. Stay in a separate room from other household members, if possible. Use a separate bathroom, if possible. Take steps to improve ventilation at home, if possible. Avoid contact with other members of the household and pets. Don't share personal household items, like cups, towels, and utensils. Wear a well-fitting mask when you need to be around other people. Learn more about what to do if you are sick and how to notify your contacts. Ending isolation for people who had COVID-19 and had symptoms If you had COVID-19 and had symptoms, isolate for at least 5 days. To calculate your 5-day isolation period, day 0 is your first day of symptoms. Day 1 is the first full day after your symptoms developed. You can leave isolation after 5 full days. You can end isolation after 5 full days if you are fever-free for 24 hours without the use of fever-reducing medication and your other symptoms have improved (Loss of taste and smell may persist for weeks or months after recovery and need not delay the end of isolation). You should continue to wear a well-fitting mask around others at home and in public for 5 additional days (day 6 through day 10) after the end of your 5-day isolation period. If you are unable to wear a mask when around others, you should continue to isolate for a full 10 days. Avoid people who have weakened immune systems or are more likely to get very sick from COVID-19, and nursing homes and other high-risk settings, until after at least 10 days. If you continue  to have fever or your other symptoms have not improved after 5 days of isolation, you should wait to end your isolation until you are fever-free for 24 hours without the use of fever-reducing medication and your other symptoms have improved. Continue to wear a well-fitting mask through day 10. Contact your healthcare provider if you have  questions. See additional information about travel. Do not go to places where you are unable to wear a mask, such as restaurants and some gyms, and avoid eating around others at home and at work until a full 10 days after your first day of symptoms. If an individual has access to a test and wants to test, the best approach is to use an antigen test1 towards the end of the 5-day isolation period. Collect the test sample only if you are fever-free for 24 hours without the use of fever-reducing medication and your other symptoms have improved (loss of taste and smell may persist for weeks or months after recovery and need not delay the end of isolation). If your test result is positive, you should continue to isolate until day 10. If your test result is negative, you can end isolation, but continue to wear a well-fitting mask around others at home and in public until day 10. Follow additional recommendations for masking and avoiding travel as described above. 1As noted in the labeling for authorized over-the counter antigen tests: Negative results should be treated as presumptive. Negative results do not rule out SARS-CoV-2 infection and should not be used as the sole basis for treatment or patient management decisions, including infection control decisions. To improve results, antigen tests should be used twice over a three-day period with at least 24 hours and no more than 48 hours between tests. Note that these recommendations on ending isolation do not apply to people who are moderately ill or very sick from COVID-19 or have weakened immune systems. See section below for recommendations for when to end isolation for these groups. Ending isolation for people who tested positive for COVID-19 but had no symptoms If you test positive for COVID-19 and never develop symptoms, isolate for at least 5 days. Day 0 is the day of your positive viral test (based on the date you were tested) and day 1 is the first full  day after the specimen was collected for your positive test. You can leave isolation after 5 full days. If you continue to have no symptoms, you can end isolation after at least 5 days. You should continue to wear a well-fitting mask around others at home and in public until day 10 (day 6 through day 10). If you are unable to wear a mask when around others, you should continue to isolate for 10 days. Avoid people who have weakened immune systems or are more likely to get very sick from COVID-19, and nursing homes and other high-risk settings, until after at least 10 days. If you develop symptoms after testing positive, your 5-day isolation period should start over. Day 0 is your first day of symptoms. Follow the recommendations above for ending isolation for people who had COVID-19 and had symptoms. See additional information about travel. Do not go to places where you are unable to wear a mask, such as restaurants and some gyms, and avoid eating around others at home and at work until 10 days after the day of your positive test. If an individual has access to a test and wants to test, the best approach is to use an  antigen test1 towards the end of the 5-day isolation period. If your test result is positive, you should continue to isolate until day 10. If your test result is positive, you can also choose to test daily and if your test result is negative, you can end isolation, but continue to wear a well-fitting mask around others at home and in public until day 10. Follow additional recommendations for masking and avoiding travel as described above. 1As noted in the labeling for authorized over-the counter antigen tests: Negative results should be treated as presumptive. Negative results do not rule out SARS-CoV-2 infection and should not be used as the sole basis for treatment or patient management decisions, including infection control decisions. To improve results, antigen tests should be used twice over  a three-day period with at least 24 hours and no more than 48 hours between tests. Ending isolation for people who were moderately or very sick from COVID-19 or have a weakened immune system People who are moderately ill from COVID-19 (experiencing symptoms that affect the lungs like shortness of breath or difficulty breathing) should isolate for 10 days and follow all other isolation precautions. To calculate your 10-day isolation period, day 0 is your first day of symptoms. Day 1 is the first full day after your symptoms developed. If you are unsure if your symptoms are moderate, talk to a healthcare provider for further guidance. People who are very sick from COVID-19 (this means people who were hospitalized or required intensive care or ventilation support) and people who have weakened immune systems might need to isolate at home longer. They may also require testing with a viral test to determine when they can be around others. CDC recommends an isolation period of at least 10 and up to 20 days for people who were very sick from COVID-19 and for people with weakened immune systems. Consult with your healthcare provider about when you can resume being around other people. If you are unsure if your symptoms are severe or if you have a weakened immune system, talk to a healthcare provider for further guidance. People who have a weakened immune system should talk to their healthcare provider about the potential for reduced immune responses to COVID-19 vaccines and the need to continue to follow current prevention measures (including wearing a well-fitting mask and avoiding crowds and poorly ventilated indoor spaces) to protect themselves against COVID-19 until advised otherwise by their healthcare provider. Close contacts of immunocompromised people--including household members--should also be encouraged to receive all recommended COVID-19 vaccine doses to help protect these people. Isolation in high-risk  congregate settings In certain high-risk congregate settings that have high risk of secondary transmission and where it is not feasible to cohort people (such as Systems analyst and detention facilities, homeless shelters, and cruise ships), CDC recommends a 10-day isolation period for residents. During periods of critical staffing shortages, facilities may consider shortening the isolation period for staff to ensure continuity of operations. Decisions to shorten isolation in these settings should be made in consultation with state, local, tribal, or territorial health departments and should take into consideration the context and characteristics of the facility. CDC's setting-specific guidance provides additional recommendations for these settings. This CDC guidance is meant to supplement--not replace--any federal, state, local, territorial, or tribal health and safety laws, rules, and regulations. Recommendations for specific settings These recommendations do not apply to healthcare professionals. For guidance specific to these settings, see Healthcare professionals: Interim Guidance for Optician, dispensing with SARS-CoV-2 Infection or Exposure to SARS-CoV-2  Patients, residents, and visitors to healthcare settings: Interim Infection Prevention and Control Recommendations for Healthcare Personnel During the [place] 2019 (COVID-19) Pandemic Additional setting-specific guidance and recommendations are available. These recommendations on quarantine and isolation do apply to [place] settings. Additional guidance is available here: Overview of COVID-19 Quarantine for K-12 Schools Travelers: Travel information and recommendations Congregate facilities and other settings: [employer] for community, work, and school settings Ongoing COVID-19 exposure FAQs I live with someone with COVID-19, but I cannot be separated from them. How do we manage quarantine in this situation? It is very  important for people with COVID-19 to remain apart from other people, if possible, even if they are living together. If separation of the person with COVID-19 from others that they live with is not possible, the other people that they live with will have ongoing exposure, meaning they will be repeatedly exposed until that person is no longer able to spread the virus to other people. In this situation, there are precautions you can take to limit the spread of COVID-19: The person with COVID-19 and everyone they live with should wear a well-fitting mask inside the home. If possible, one person should care for the person with COVID-19 to limit the number of people who are in close contact with the infected person. Take steps to protect yourself and others to reduce transmission in the home: Quarantine if you are not up to date with your COVID-19 vaccines. Isolate if you are sick or tested positive for COVID-19, even if you don't have symptoms. Learn more about the public health recommendations for testing, mask use and quarantine of close contacts, like yourself, who have ongoing exposure. These recommendations differ depending on your vaccination status. What should I do if I have ongoing exposure to COVID-19 from someone I live with? Recommendations for this situation depend on your vaccination status: If you are not up to date on COVID-19 vaccines and have ongoing exposure to COVID-19, you should: Begin quarantine immediately and continue to quarantine throughout the isolation period of the person with COVID-19. Continue to quarantine for an additional 5 days starting the day after the end of isolation for the person with COVID-19. Get tested at least 5 days after the end of isolation of the infected person that lives with them. If you test negative, you can leave the home but should continue to wear a well-fitting mask when around others at home and in public until 10 days after the end of isolation  for the person with COVID-19. Isolate immediately if you develop symptoms of COVID-19 or test positive. If you are up to date with COVID-19 vaccines and have ongoing exposure to COVID-19, you should: Get tested at least 5 days after your first exposure. A person with COVID-19 is considered infectious starting 2 days before they develop symptoms, or 2 days before the date of their positive test if they do not have symptoms. Get tested again at least 5 days after the end of isolation for the person with COVID-19. Wear a well-fitting mask when you are around the person with COVID-19, and do this throughout their isolation period. Wear a well-fitting mask around others for 10 days after the infected person's isolation period ends. Isolate immediately if you develop symptoms of COVID-19 or test positive. What should I do if multiple people I live with test positive for COVID-19 at different times? Recommendations for this situation depend on your vaccination status: If you are not up to date with your  COVID-19 vaccines, you should: Quarantine throughout the isolation period of any infected person that you live with. Continue to quarantine until 5 days after the end of isolation date for the most recently infected person that lives with you. For example, if the last day of isolation of the person most recently infected with COVID-19 was June 30, the new 5-day quarantine period starts on July 1. Get tested at least 5 days after the end of isolation for the most recently infected person that lives with you. Wear a well-fitting mask when you are around any person with COVID-19 while that person is in isolation. Wear a well-fitting mask when you are around other people until 10 days after your last close contact. Isolate immediately if you develop symptoms of COVID-19 or test positive. If you are up to date with your COVID-19 vaccines, you should: Get tested at least 5 days after your first exposure. A  person with COVID-19 is considered infectious starting 2 days before they developed symptoms, or 2 days before the date of their positive test if they do not have symptoms. Get tested again at least 5 days after the end of isolation for the most recently infected person that lives with you. Wear a well-fitting mask when you are around any person with COVID-19 while that person is in isolation. Wear a well-fitting mask around others for 10 days after the end of isolation for the most recently infected person that lives with you. For example, if the last day of isolation for the person most recently infected with COVID-19 was June 30, the new 10-day period to wear a well-fitting mask indoors in public starts on July 1. Isolate immediately if you develop symptoms of COVID-19 or test positive. I had COVID-19 and completed isolation. Do I have to quarantine or get tested if someone I live with gets COVID-19 shortly after I completed isolation? No. If you recently completed isolation and someone that lives with you tests positive for the virus that causes COVID-19 shortly after the end of your isolation period, you do not have to quarantine or get tested as long as you do not develop new symptoms. Once all of the people that live together have completed isolation or quarantine, refer to the guidance below for new exposures to COVID-19. If you had COVID-19 in the previous 90 days and then came into close contact with someone with COVID-19, you do not have to quarantine or get tested if you do not have symptoms. But you should: Wear a well-fitting mask indoors in public for 10 days after your last close contact. Monitor for COVID-19 symptoms for 10 days from the date of your last close contact. Isolate immediately and get tested if symptoms develop. If more than 90 days have passed since your recovery from infection, follow CDC's recommendations for close contacts. These recommendations will differ depending on  your vaccination status. 03/01/2021 Content source: [facility] ([facility]), Division of Viral Diseases This information is not intended to replace advice given to you by your health care provider. Make sure you discuss any questions you have with your health care provider. Document Revised: 07/05/2021 Document Reviewed: 07/05/2021 Elsevier Patient Education  2022 [employer].    If you have been instructed to have an in-person evaluation today at a local Urgent Care facility, please use the link below. It will take you to a list of all of our available [facility], including address, phone number and hours  of operation. Please do not delay care.  [facility]  If you or a family member do not have a primary care provider, use the link below to schedule a visit and establish care. When you choose a [facility] primary care physician or advanced practice provider, you gain a long-term partner in health. Find a Primary Care Provider  Learn more about [facility]'s in-office and virtual care options: [facility]

## 2021-12-18 NOTE — Progress Notes (Signed)
Virtual Visit Consent   Dennis Simmons, you are scheduled for a virtual visit with a Dennis Simmons provider today.     Just as with appointments in the office, your consent must be obtained to participate.  Your consent will be active for this visit and any virtual visit you may have with one of our providers in the next 365 days.     If you have a MyChart account, a copy of this consent can be sent to you electronically.  All virtual visits are billed to your insurance company just like a traditional visit in the office.    As this is a virtual visit, video technology does not allow for your provider to perform a traditional examination.  This may limit your provider's ability to fully assess your condition.  If your provider identifies any concerns that need to be evaluated in person or the need to arrange testing (such as labs, EKG, etc.), we will make arrangements to do so.     Although advances in technology are sophisticated, we cannot ensure that it will always work on either your end or our end.  If the connection with a video visit is poor, the visit may have to be switched to a telephone visit.  With either a video or telephone visit, we are not always able to ensure that we have a secure connection.     I need to obtain your verbal consent now.   Are you willing to proceed with your visit today?    Dennis Simmons has provided verbal consent on 12/18/2021 for a virtual visit (video or telephone).   Dennis Daring, PA-C   Date: 12/18/2021 9:36 AM   Virtual Visit via Video Note   IMar Simmons, connected with  Dennis Simmons  ([id number], 1996-08-26) on 12/18/21 at  9:30 AM EST by a video-enabled telemedicine application and verified that I am speaking with the correct person using two identifiers.  Location: Patient: Virtual Visit Location Patient: Home Provider: Virtual Visit Location Provider: Home Office   I discussed the limitations of evaluation and management by  telemedicine and the availability of in person appointments. The patient expressed understanding and agreed to proceed.    History of Present Illness: Dennis Simmons is a 26 y.o. who identifies as a male who was assigned male at birth, and is being seen today for fever and nausea, vomiting, diarrhea.  HPI: Emesis  This is a new problem. The current episode started in the past 7 days (Saturday). The problem has been unchanged. The emesis has an appearance of stomach contents and bile. The maximum temperature recorded prior to his arrival was 101 - 101.9 F (Sunday). Associated symptoms include chills, coughing, diarrhea, a fever, headaches, myalgias and URI. Risk factors include ill contacts. He has tried bed rest, increased fluids and sleep (theraflu) for the symptoms. The treatment provided no relief.     Problems:  Patient Active Problem List   Diagnosis Date Noted   Rhabdomyolysis 09/06/2018   Dehydration 09/06/2018   AKI (acute kidney injury) ([place]) 09/06/2018   Tobacco abuse 09/06/2018   Leukocytosis 09/06/2018   ADHD 09/06/2018   Bacteriuria with pyuria 09/06/2018    Allergies:  Allergies  Allergen Reactions   Tylenol [Acetaminophen] Other (See Comments)    Unknown, "was told by his father, that he had hives at as an infant"   Medications:  Current Outpatient Medications:    benzonatate (TESSALON) 100 MG capsule, Take 1  capsule (100 mg total) by mouth 3 (three) times daily as needed., Disp: 30 capsule, Rfl: 0   ipratropium (ATROVENT) 0.03 % nasal spray, Place 2 sprays into both nostrils every 12 (twelve) hours., Disp: 30 mL, Rfl: 0   ondansetron (ZOFRAN-ODT) 4 MG disintegrating tablet, Take 1 tablet (4 mg total) by mouth every 8 (eight) hours as needed for nausea or vomiting., Disp: 20 tablet, Rfl: 0   albuterol (PROVENTIL HFA;VENTOLIN HFA) 108 (90 Base) MCG/ACT inhaler, Inhale 2 puffs into the lungs every 6 (six) hours as needed for wheezing or shortness of breath., Disp: , Rfl:     cyclobenzaprine (FLEXERIL) 10 MG tablet, Take 1 tablet (10 mg total) by mouth daily as needed for muscle spasms., Disp: 30 tablet, Rfl: 0   diazepam (VALIUM) 5 MG tablet, Take 1 tablet (5 mg total) by mouth every 12 (twelve) hours as needed for muscle spasms. (Patient not taking: Reported on 09/11/2019), Disp: 10 tablet, Rfl: 0   gabapentin (NEURONTIN) 100 MG capsule, Take 100 mg by mouth at bedtime as needed (neuropathy). , Disp: , Rfl:    meloxicam (MOBIC) 15 MG tablet, Take 1 tablet (15 mg total) by mouth daily as needed for pain., Disp: 30 tablet, Rfl: 0   methocarbamol (ROBAXIN) 500 MG tablet, Take 1 tablet (500 mg total) by mouth every 8 (eight) hours as needed for muscle spasms. (Patient not taking: Reported on 09/11/2019), Disp: 20 tablet, Rfl: 0   methocarbamol (ROBAXIN) 500 MG tablet, Take 1 tablet (500 mg total) by mouth every 8 (eight) hours as needed for muscle spasms., Disp: 30 tablet, Rfl: 0   naproxen (NAPROSYN) 500 MG tablet, Take 1 tablet (500 mg total) by mouth 2 (two) times daily., Disp: 30 tablet, Rfl: 0   predniSONE (STERAPRED UNI-PAK 21 TAB) 5 MG (21) TBPK tablet, Take dosepak as directed (Patient not taking: Reported on 09/11/2019), Disp: 21 tablet, Rfl: 0  Observations/Objective: Patient is well-developed, well-nourished in no acute distress.  Resting comfortably at home.  Head is normocephalic, atraumatic.  No labored breathing.  Speech is clear and coherent with logical content.  Patient is alert and oriented at baseline.    Assessment and Plan: 1. Viral infection - ondansetron (ZOFRAN-ODT) 4 MG disintegrating tablet; Take 1 tablet (4 mg total) by mouth every 8 (eight) hours as needed for nausea or vomiting.  Dispense: 20 tablet; Refill: 0 - benzonatate (TESSALON) 100 MG capsule; Take 1 capsule (100 mg total) by mouth 3 (three) times daily as needed.  Dispense: 30 capsule; Refill: 0 - ipratropium (ATROVENT) 0.03 % nasal spray; Place 2 sprays into both nostrils every  12 (twelve) hours.  Dispense: 30 mL; Refill: 0  - Suspect covid or influenza - Continue symptomatic management OTC of choice - Tessalon, Zofran and Ipratropium nasal spray prescribed for symptom management - He will take Covid test and respond with result - If negative for Covid suspect influenza and will continue symptomatic management - Push fluids - Rest - Seek in person evaluation if symptoms worsen or fail to improve  Follow Up Instructions: I discussed the assessment and treatment plan with the patient. The patient was provided an opportunity to ask questions and all were answered. The patient agreed with the plan and demonstrated an understanding of the instructions.  A copy of instructions were sent to the patient via MyChart unless otherwise noted below.    The patient was advised to call back or seek an in-person evaluation if the symptoms worsen or if the  condition fails to improve as anticipated.  Time:  I spent 13 minutes with the patient via telehealth technology discussing the above problems/concerns.    Dennis Daring, PA-C

## 2022-02-03 ENCOUNTER — Emergency Department (HOSPITAL_COMMUNITY)
Admission: EM | Admit: 2022-02-03 | Discharge: 2022-02-03 | Disposition: A | Discharge status: Home / Self Care | Payer: Self-pay | Attending: Emergency Medicine | Admitting: Emergency Medicine

## 2022-02-03 ENCOUNTER — Other Ambulatory Visit: Payer: Self-pay

## 2022-02-03 ENCOUNTER — Encounter (HOSPITAL_COMMUNITY): Payer: Self-pay | Admitting: Emergency Medicine

## 2022-02-03 DIAGNOSIS — R109 Unspecified abdominal pain: Secondary | ICD-10-CM | POA: Insufficient documentation

## 2022-02-03 DIAGNOSIS — F1290 Cannabis use, unspecified, uncomplicated: Secondary | ICD-10-CM | POA: Insufficient documentation

## 2022-02-03 DIAGNOSIS — R0981 Nasal congestion: Secondary | ICD-10-CM | POA: Insufficient documentation

## 2022-02-03 DIAGNOSIS — R112 Nausea with vomiting, unspecified: Secondary | ICD-10-CM | POA: Insufficient documentation

## 2022-02-03 DIAGNOSIS — D72829 Elevated white blood cell count, unspecified: Secondary | ICD-10-CM | POA: Insufficient documentation

## 2022-02-03 DIAGNOSIS — R059 Cough, unspecified: Secondary | ICD-10-CM | POA: Insufficient documentation

## 2022-02-03 DIAGNOSIS — R197 Diarrhea, unspecified: Secondary | ICD-10-CM | POA: Insufficient documentation

## 2022-02-03 LAB — RAPID URINE DRUG SCREEN, HOSP PERFORMED
Amphetamines: NOT DETECTED
Barbiturates: NOT DETECTED
Benzodiazepines: NOT DETECTED
Cocaine: NOT DETECTED
Opiates: NOT DETECTED
Tetrahydrocannabinol: POSITIVE — AB

## 2022-02-03 LAB — COMPREHENSIVE METABOLIC PANEL
ALT: 29 U/L (ref 0–44)
AST: 27 U/L (ref 15–41)
Albumin: 4.4 g/dL (ref 3.5–5.0)
Alkaline Phosphatase: 86 U/L (ref 38–126)
Anion gap: 7 (ref 5–15)
BUN: 15 mg/dL (ref 6–20)
CO2: 29 mmol/L (ref 22–32)
Calcium: 9.2 mg/dL (ref 8.9–10.3)
Chloride: 103 mmol/L (ref 98–111)
Creatinine, Ser: 0.91 mg/dL (ref 0.61–1.24)
GFR, Estimated: >60 mL/min (ref >60)
Glucose, Bld: 110 mg/dL — ABNORMAL HIGH (ref 70–99)
Potassium: 4 mmol/L (ref 3.5–5.1)
Sodium: 139 mmol/L (ref 135–145)
Total Bilirubin: 0.7 mg/dL (ref 0.3–1.2)
Total Protein: 8.2 g/dL — ABNORMAL HIGH (ref 6.5–8.1)

## 2022-02-03 LAB — URINALYSIS, ROUTINE W REFLEX MICROSCOPIC
Bacteria, UA: NONE SEEN
Bilirubin Urine: NEGATIVE
Glucose, UA: NEGATIVE mg/dL
Hgb urine dipstick: NEGATIVE
Ketones, ur: NEGATIVE mg/dL
Nitrite: NEGATIVE
Protein, ur: 30 mg/dL — AB
Specific Gravity, Urine: 1.021 (ref 1.005–1.030)
pH: 8 (ref 5.0–8.0)

## 2022-02-03 LAB — CBC WITH DIFFERENTIAL/PLATELET
Abs Immature Granulocytes: 0.06 10*3/uL (ref 0.00–0.07)
Basophils Absolute: 0.1 10*3/uL (ref 0.0–0.1)
Basophils Relative: 0 %
Eosinophils Absolute: 0 10*3/uL (ref 0.0–0.5)
Eosinophils Relative: 0 %
HCT: 51.1 % (ref 39.0–52.0)
Hemoglobin: 16.8 g/dL (ref 13.0–17.0)
Immature Granulocytes: 0 %
Lymphocytes Relative: 9 %
Lymphs Abs: 1.3 10*3/uL (ref 0.7–4.0)
MCH: 30.5 pg (ref 26.0–34.0)
MCHC: 32.9 g/dL (ref 30.0–36.0)
MCV: 92.7 fL (ref 80.0–100.0)
Monocytes Absolute: 0.3 10*3/uL (ref 0.1–1.0)
Monocytes Relative: 2 %
Neutro Abs: 12.1 10*3/uL — ABNORMAL HIGH (ref 1.7–7.7)
Neutrophils Relative %: 89 %
Platelets: 256 10*3/uL (ref 150–400)
RBC: 5.51 MIL/uL (ref 4.22–5.81)
RDW: 14.5 % (ref 11.5–15.5)
WBC: 13.7 10*3/uL — ABNORMAL HIGH (ref 4.0–10.5)
nRBC: 0 % (ref 0.0–0.2)

## 2022-02-03 LAB — LIPASE, BLOOD: Lipase: 41 U/L (ref 11–51)

## 2022-02-03 MED ORDER — ONDANSETRON HCL 4 MG PO TABS: 4.0000 mg | ORAL_TABLET | Freq: Three times a day (TID) | ORAL | 0 refills | Status: DC | PRN

## 2022-02-03 MED ORDER — MORPHINE SULFATE (PF) 4 MG/ML IV SOLN
4.0000 mg | Freq: Once | INTRAVENOUS | Status: AC
  Administered 2022-02-03: 4 mg via INTRAVENOUS
  Filled 2022-02-03: qty 1

## 2022-02-03 MED ORDER — ONDANSETRON HCL 4 MG/2ML IJ SOLN
4.0000 mg | Freq: Once | INTRAMUSCULAR | Status: AC
  Administered 2022-02-03: 4 mg via INTRAVENOUS
  Filled 2022-02-03: qty 2

## 2022-02-03 MED ORDER — SODIUM CHLORIDE 0.9 % IV BOLUS
1000.0000 mL | Freq: Once | INTRAVENOUS | Status: AC
  Administered 2022-02-03: 1000 mL via INTRAVENOUS

## 2022-02-03 NOTE — Discharge Instructions (Signed)
Your symptoms may be due to a stomach bug which should improve in the next 24 to 48 hours.  Sometimes the symptoms can also be related to marijuana use therefore I will recommend avoid such use as it may negatively impact your health.  Early appendicitis can also manifest similar to what you are experiencing so  return to the ER if your symptoms becomes much worse or if you have any other concern. ?

## 2022-02-03 NOTE — ED Triage Notes (Signed)
Reports emesis since last 1 am this morning, had some ETOH and chicken. Has tried to drink ginger ale, vomits it back up. Reports body aches and abdominal pain. Also reports diarrhea 'all night.' Reports partner had E. Coli recently.  ?

## 2022-02-03 NOTE — ED Provider Notes (Signed)
[facility]-EMERGENCY DEPT Provider Note   CSN: [id number] Arrival date & time: 02/03/22  0859     History  Chief Complaint  Patient presents with   Emesis   Abdominal Pain    Dennis Simmons is a 26 y.o. male.  The history is provided by the patient and a significant other. No language interpreter was used.  Emesis Associated symptoms: abdominal pain   Abdominal Pain Associated symptoms: vomiting    26 year old male with a prior surgical history including appendectomy presenting today with complaints of abdominal pain.  Patient reported earlier this morning he was woke with pain in his abdomen, persistent nausea, vomiting multiple episodes of bilious content as well as having loose stools.  Symptoms moderate in severity, at this time vomiting seems to ease a bit but he still feels nauseous.  Reports last night he did drink several beers, last used marijuana on occasion this was 2 days ago.  He also endorsed some congestion and cough and vomiting.  He denies fever but does endorse some chills and body aches.  He mention that his partner had C. difficile 2 weeks ago.  Home Medications Prior to Admission medications   Medication Sig Start Date End Date Taking? Authorizing Provider  albuterol (PROVENTIL HFA;VENTOLIN HFA) 108 (90 Base) MCG/ACT inhaler Inhale 2 puffs into the lungs every 6 (six) hours as needed for wheezing or shortness of breath.    [provider]  benzonatate (TESSALON) 100 MG capsule Take 1 capsule (100 mg total) by mouth 3 (three) times daily as needed. 12/18/21   [name], PA-C  cyclobenzaprine (FLEXERIL) 10 MG tablet Take 1 tablet (10 mg total) by mouth daily as needed for muscle spasms. 09/11/19   Magnant, [name] L, PA-C  diazepam (VALIUM) 5 MG tablet Take 1 tablet (5 mg total) by mouth every 12 (twelve) hours as needed for muscle spasms. Patient not taking: Reported on 09/11/2019 08/08/19   Loren Racer, MD  gabapentin  (NEURONTIN) 100 MG capsule Take 100 mg by mouth at bedtime as needed (neuropathy).     [provider]  ipratropium (ATROVENT) 0.03 % nasal spray Place 2 sprays into both nostrils every 12 (twelve) hours. 12/18/21   [name], PA-C  meloxicam (MOBIC) 15 MG tablet Take 1 tablet (15 mg total) by mouth daily as needed for pain. 09/11/19   Magnant, [name] L, PA-C  methocarbamol (ROBAXIN) 500 MG tablet Take 1 tablet (500 mg total) by mouth every 8 (eight) hours as needed for muscle spasms. Patient not taking: Reported on 09/11/2019 08/03/19   Terrilee Files, MD  methocarbamol (ROBAXIN) 500 MG tablet Take 1 tablet (500 mg total) by mouth every 8 (eight) hours as needed for muscle spasms. 08/21/19   Cammy Copa, MD  naproxen (NAPROSYN) 500 MG tablet Take 1 tablet (500 mg total) by mouth 2 (two) times daily. 11/08/19   Henderly, Britni A, PA-C  ondansetron (ZOFRAN-ODT) 4 MG disintegrating tablet Take 1 tablet (4 mg total) by mouth every 8 (eight) hours as needed for nausea or vomiting. 12/18/21   [name], PA-C  predniSONE (STERAPRED UNI-PAK 21 TAB) 5 MG (21) TBPK tablet Take dosepak as directed Patient not taking: Reported on 09/11/2019 08/21/19   Cammy Copa, MD      Allergies    Tylenol [acetaminophen]    Review of Systems   Review of Systems  Gastrointestinal:  Positive for abdominal pain and vomiting.  All other systems reviewed and  are negative.  Physical Exam Updated Vital Signs BP 138/86 (BP Location: Left Arm)    Pulse 68    Temp 98.3 F (36.8 C) (Oral)    Resp 18    Ht 6\' 3"  (1.905 m)    Wt 86.2 kg    SpO2 97%    BMI 23.75 kg/m  Physical Exam Vitals and nursing note reviewed.  Constitutional:      General: He is not in acute distress.    Appearance: He is well-developed.  HENT:     Head: Atraumatic.  Eyes:     Conjunctiva/sclera: Conjunctivae normal.  Cardiovascular:     Rate and Rhythm: Normal rate and regular rhythm.  Pulmonary:      Effort: Pulmonary effort is normal.     Breath sounds: Normal breath sounds.  Abdominal:     General: Abdomen is flat.     Palpations: Abdomen is soft.     Hernia: No hernia is present.     Comments: Abdomen is soft nontender on palpation.  Negative Murphy sign, no pain at McBurney's point  Musculoskeletal:     Cervical back: Neck supple.  Skin:    Findings: No rash.  Neurological:     Mental Status: He is alert.    ED Results / Procedures / Treatments   Labs (all labs ordered are listed, but only abnormal results are displayed) Labs Reviewed  CBC WITH DIFFERENTIAL/PLATELET - Abnormal; Notable for the following components:      Result Value   WBC 13.7 (*)    Neutro Abs 12.1 (*)    All other components within normal limits  COMPREHENSIVE METABOLIC PANEL  LIPASE, BLOOD  URINALYSIS, ROUTINE W REFLEX MICROSCOPIC  RAPID URINE DRUG SCREEN, HOSP PERFORMED    EKG None  Radiology No results found.  Procedures Procedures    Medications Ordered in ED Medications - No data to display  ED Course/ Medical Decision Making/ A&P                           Medical Decision Making Amount and/or Complexity of Data Reviewed Labs: ordered.   BP 138/86 (BP Location: Left Arm)    Pulse 68    Temp 98.3 F (36.8 C) (Oral)    Resp 18    Ht 6\' 3"  (1.905 m)    Wt 86.2 kg    SpO2 97%    BMI 23.75 kg/m   9:59 AM This is a 26 year old male with prior surgical history including appendectomy presenting with complaints of abdominal pain/nausea and diarrhea. This morning. Multiple episodes of vomiting as well as having diarrhea.  Symptoms seem to improve a bit since he got here.  At this time, patient is afebrile, normal blood pressures and normal vital signs.  On exam, abdomen is soft nontender  Since patient did report recent sick contact including his partner who was previously diagnosed with C. difficile, I will check this again, my suspicion is low.  He does endorse marijuana use as this  could potentially be cannabinoid hyperemesis syndrome.  He has had prior appendectomy.  He does not have any significant tenderness to his right upper quadrant to suggest an ongoing biliary disease such as cholecystitis.  11:30 AM Labs obtained and independently reviewed interpreted by me.  Mild elevated white count of 13.7 likely stress demargination versus infection.  Electrolyte panels are reassuring, normal lipase, urinalysis without signs of urinary tract infection, UDS positive for THC.  Patient  received IV fluid, antinausea medication and pain medication.  On reexam he felt much better.  Suspect symptoms possibly due to gastroenteritis   This patient presents to the ED for concern of abd pain, this involves an extensive number of treatment options, and is a complaint that carries with it a high risk of complications and morbidity.  The differential diagnosis includes gastroenteritis, colitis, diverticulitis, billiary disease, pancreatitis,   Co morbidities that complicate the patient evaluation marijuana use Additional history obtained:  Additional history obtained from girl friend at bedside External records from outside source obtained and reviewed including prior ER visits  Lab Tests:  I Ordered, and personally interpreted labs.  The pertinent results include:  WBC 13.7, likely stress demargination or underlying infectious cause   Cardiac Monitoring:  The patient was maintained on a cardiac monitor.  I personally viewed and interpreted the cardiac monitored which showed an underlying rhythm of: NSR  Medicines ordered and prescription drug management:  I ordered medication including morphine  for pain control Reevaluation of the patient after these medicines showed that the patient improved I have reviewed the patients home medicines and have made adjustments as needed  Test Considered: abd/pelvis CT but due to benign abdominal exam, felt low risk for acute abdominal  pathology  Critical Interventions: IV pain medication  IV antiemetic   Problem List / ED Course: abd pain  N/V/D  Reevaluation:  After the interventions noted above, I reevaluated the patient and found that they have :improved  Social Determinants of Health: no PCP  Dispostion:  After consideration of the diagnostic results and the patients response to treatment, I feel that the patent would benefit from abstaining from marijuana use, return if worsen but stable for discharge.         Final Clinical Impression(s) / ED Diagnoses Final diagnoses:  Nausea vomiting and diarrhea    Rx / DC Orders ED Discharge Orders          Ordered    ondansetron (ZOFRAN) 4 MG tablet  Every 8 hours PRN        02/03/22 1138              Fayrene Helper, PA-C 02/03/22 1140    Benjiman Core, MD 02/03/22 1528

## 2022-03-28 ENCOUNTER — Ambulatory Visit: Payer: Self-pay | Admitting: Podiatry

## 2022-04-06 ENCOUNTER — Ambulatory Visit: Payer: Self-pay | Admitting: Podiatry

## 2022-09-24 ENCOUNTER — Telehealth: Payer: Self-pay | Admitting: Nurse Practitioner

## 2022-09-24 ENCOUNTER — Telehealth: Payer: Self-pay | Admitting: Physician Assistant

## 2022-09-24 ENCOUNTER — Encounter: Payer: Self-pay | Admitting: Nurse Practitioner

## 2022-09-24 DIAGNOSIS — Z202 Contact with and (suspected) exposure to infections with a predominantly sexual mode of transmission: Secondary | ICD-10-CM

## 2022-09-24 DIAGNOSIS — R300 Dysuria: Secondary | ICD-10-CM

## 2022-09-24 MED ORDER — METRONIDAZOLE 500 MG PO TABS: 500.0000 mg | ORAL_TABLET | Freq: Two times a day (BID) | ORAL | 0 refills | Status: AC

## 2022-09-24 NOTE — Progress Notes (Signed)
Virtual Visit Consent   Dennis Simmons, you are scheduled for a virtual visit with a [facility] provider today. Just as with appointments in the office, your consent must be obtained to participate. Your consent will be active for this visit and any virtual visit you may have with one of our providers in the next 365 days. If you have a MyChart account, a copy of this consent can be sent to you electronically.  As this is a virtual visit, video technology does not allow for your provider to perform a traditional examination. This may limit your provider's ability to fully assess your condition. If your provider identifies any concerns that need to be evaluated in person or the need to arrange testing (such as labs, EKG, etc.), we will make arrangements to do so. Although advances in technology are sophisticated, we cannot ensure that it will always work on either your end or our end. If the connection with a video visit is poor, the visit may have to be switched to a telephone visit. With either a video or telephone visit, we are not always able to ensure that we have a secure connection.  By engaging in this virtual visit, you consent to the provision of healthcare and authorize for your insurance to be billed (if applicable) for the services provided during this visit. Depending on your insurance coverage, you may receive a charge related to this service.  I need to obtain your verbal consent now. Are you willing to proceed with your visit today? Dennis Simmons has provided verbal consent on 09/24/2022 for a virtual visit (video or telephone). Dennis Simas, FNP  Date: 09/24/2022 10:46 AM  Virtual Visit via Video Note   I, Dennis Simmons, connected with  Dennis Simmons  ([id number], 09-25-1996) on 09/24/22 at 10:45 AM EDT by a video-enabled telemedicine application and verified that I am speaking with the correct person using two identifiers.  Location: Patient: Virtual Visit Location Patient:  Home Provider: Virtual Visit Location Provider: Home Office   I discussed the limitations of evaluation and management by telemedicine and the availability of in person appointments. The patient expressed understanding and agreed to proceed.    History of Present Illness: Dennis Simmons is a 26 y.o. who identifies as a male who was assigned male at birth, and is being seen today for consultation regarding contact with someone known to have trich.  Girlfriend recently tested positive for trichomoniasis He has had Chlamydia in the past without known treatment (2020).  He has some discomfort at the end of urination that has been present for the past year without other symptoms  Currently does not have insurance     Problems:  Patient Active Problem List   Diagnosis Date Noted   Rhabdomyolysis 09/06/2018   Dehydration 09/06/2018   AKI (acute kidney injury) (HCC) 09/06/2018   Tobacco abuse 09/06/2018   Leukocytosis 09/06/2018   ADHD 09/06/2018   Bacteriuria with pyuria 09/06/2018    Allergies:  Allergies  Allergen Reactions   Tylenol [Acetaminophen] Other (See Comments)    Unknown, "was told by his father, that he had hives at as an infant"   Medications:  Current Outpatient Medications:    albuterol (PROVENTIL HFA;VENTOLIN HFA) 108 (90 Base) MCG/ACT inhaler, Inhale 2 puffs into the lungs every 6 (six) hours as needed for wheezing or shortness of breath., Disp: , Rfl:    meloxicam (MOBIC) 15 MG tablet, Take 1 tablet (15 mg total) by mouth daily as  needed for pain., Disp: 30 tablet, Rfl: 0   methocarbamol (ROBAXIN) 500 MG tablet, Take 1 tablet (500 mg total) by mouth every 8 (eight) hours as needed for muscle spasms., Disp: 20 tablet, Rfl: 0   methocarbamol (ROBAXIN) 500 MG tablet, Take 1 tablet (500 mg total) by mouth every 8 (eight) hours as needed for muscle spasms., Disp: 30 tablet, Rfl: 0   naproxen (NAPROSYN) 500 MG tablet, Take 1 tablet (500 mg total) by mouth 2 (two) times  daily. (Patient taking differently: Take 500 mg by mouth daily as needed for moderate pain or headache.), Disp: 30 tablet, Rfl: 0   ondansetron (ZOFRAN) 4 MG tablet, Take 1 tablet (4 mg total) by mouth every 8 (eight) hours as needed for nausea or vomiting., Disp: 12 tablet, Rfl: 0  Observations/Objective: Patient is well-developed, well-nourished in no acute distress.  Resting comfortably  at home.  Head is normocephalic, atraumatic.  No labored breathing.  Speech is clear and coherent with logical content.  Patient is alert and oriented at baseline.    Assessment and Plan: 1. Contact with and (suspected) exposure to infections with a predominantly sexual mode of transmission  Patient will call [facility] today to schedule STI testing    - metroNIDAZOLE (FLAGYL) 500 MG tablet; Take 1 tablet (500 mg total) by mouth 2 (two) times daily for 7 days.  Dispense: 14 tablet; Refill: 0     Follow Up Instructions: I discussed the assessment and treatment plan with the patient. The patient was provided an opportunity to ask questions and all were answered. The patient agreed with the plan and demonstrated an understanding of the instructions.  A copy of instructions were sent to the patient via MyChart unless otherwise noted below.   The patient was advised to call back or seek an in-person evaluation if the symptoms worsen or if the condition fails to improve as anticipated.  Time:  I spent 15 minutes with the patient via telehealth technology discussing the above problems/concerns.    Apolonio Schneiders, FNP

## 2022-09-24 NOTE — Progress Notes (Signed)
E-Visit for Urinary Problems ? ?Based on what you shared with me, I feel your condition warrants further evaluation and I recommend that you be seen for a face to face office visit.  Male bladder infections are not very common.  We worry about prostate or kidney conditions.  The standard of care is to examine the abdomen and kidneys, and to do a urine and blood test to make sure that something more serious is not going on.  We recommend that you see a provider today.  If your doctor's office is closed [facility] has the following Urgent Cares: ? ?  ?NOTE: You will not be charged for this e-visit. ? ?If you are having a true medical emergency please call 911.   ? ?  ? For an urgent face to face visit, [facility] has six urgent care centers for your convenience:  ?  ? [facility] [place] ?Get Driving Directions ?[phone] ?[address] ?[place], [place] [address] ?  ? [facility] ?Get Driving Directions ?[phone] ?[address] ?[place], [place] [address] ? ?[facility] [facility] [place] ?Get Driving Directions ?[phone] ?[address] ?[place],  [place]  [address] ? ?[facility] ?Get Driving Directions ?[phone] ?[address] ?[place], [place] [address] ?  ?[facility] ?Get Driving Directions  ?[phone] ?[address] ?[address] ?[place], [place] [address] ?  ?[facility] [place] ?Get Driving Directions ?[phone] ?[address] ?[place], [place] [address] ? ?Your MyChart E-visit questionnaire answers were reviewed by a board certified advanced clinical practitioner to complete your personal care plan based on your specific symptoms.  Thank you for using e-Visits. ?

## 2022-09-24 NOTE — Patient Instructions (Signed)
  [facility] Department STD testing [address] [place], [place]  ~28.1 mi [phone]

## 2022-10-26 ENCOUNTER — Encounter (HOSPITAL_BASED_OUTPATIENT_CLINIC_OR_DEPARTMENT_OTHER): Payer: Self-pay | Admitting: Emergency Medicine

## 2022-10-26 ENCOUNTER — Other Ambulatory Visit: Payer: Self-pay

## 2022-10-26 ENCOUNTER — Emergency Department (HOSPITAL_BASED_OUTPATIENT_CLINIC_OR_DEPARTMENT_OTHER)
Admission: EM | Admit: 2022-10-26 | Discharge: 2022-10-26 | Disposition: A | Discharge status: Home / Self Care | Payer: Self-pay | Attending: Emergency Medicine | Admitting: Emergency Medicine

## 2022-10-26 DIAGNOSIS — R112 Nausea with vomiting, unspecified: Secondary | ICD-10-CM | POA: Insufficient documentation

## 2022-10-26 MED ORDER — ONDANSETRON 4 MG PO TBDP
8.0000 mg | ORAL_TABLET | Freq: Once | ORAL | Status: AC
  Administered 2022-10-26: 8 mg via ORAL
  Filled 2022-10-26: qty 2

## 2022-10-26 MED ORDER — ONDANSETRON 4 MG PO TBDP: 4.0000 mg | ORAL_TABLET | Freq: Three times a day (TID) | ORAL | 0 refills | Status: DC | PRN

## 2022-10-26 NOTE — ED Triage Notes (Signed)
Vomiting state possible alcohol postioning from drinking last night.

## 2022-10-26 NOTE — Discharge Instructions (Addendum)
Take Zofran as needed as prescribed for nausea and vomiting. Return to ER for worsening or concerning symptoms.  Follow-up with your doctor if symptoms persist. Recommend bland diet, progress to simple foods as tolerated.

## 2022-10-26 NOTE — ED Provider Notes (Signed)
[facility] EMERGENCY DEPARTMENT Provider Note   CSN: [id number] Arrival date & time: 10/26/22  1957     History  Chief Complaint  Patient presents with   Emesis    Dennis Simmons is a 26 y.o. male.  26 year old male presents with complaint of nausea and vomiting after heavy drinking last night.  Patient reports significant vomiting all day long, unable to tolerate anything by mouth until about 20 minutes ago when he switched from water to Gatorade and ginger ale.  Patient has been able to tolerate this without any further vomiting.  States that he has had chills at times without fevers, abdominal pain at times however not currently, denies changes in bowel or bladder habits.  No sick contacts.  No other complaints or concerns.       Home Medications Prior to Admission medications   Medication Sig Start Date End Date Taking? Authorizing Provider  ondansetron (ZOFRAN-ODT) 4 MG disintegrating tablet Take 1 tablet (4 mg total) by mouth every 8 (eight) hours as needed for nausea or vomiting. 10/26/22  Yes Jeannie Fend, PA-C  albuterol (PROVENTIL HFA;VENTOLIN HFA) 108 (90 Base) MCG/ACT inhaler Inhale 2 puffs into the lungs every 6 (six) hours as needed for wheezing or shortness of breath.    [provider]  meloxicam (MOBIC) 15 MG tablet Take 1 tablet (15 mg total) by mouth daily as needed for pain. 09/11/19   Magnant, [name] L, PA-C  methocarbamol (ROBAXIN) 500 MG tablet Take 1 tablet (500 mg total) by mouth every 8 (eight) hours as needed for muscle spasms. 08/03/19   Terrilee Files, MD  methocarbamol (ROBAXIN) 500 MG tablet Take 1 tablet (500 mg total) by mouth every 8 (eight) hours as needed for muscle spasms. 08/21/19   Cammy Copa, MD  naproxen (NAPROSYN) 500 MG tablet Take 1 tablet (500 mg total) by mouth 2 (two) times daily. Patient taking differently: Take 500 mg by mouth daily as needed for moderate pain or headache. 11/08/19   Henderly, Britni A,  PA-C  ondansetron (ZOFRAN) 4 MG tablet Take 1 tablet (4 mg total) by mouth every 8 (eight) hours as needed for nausea or vomiting. 02/03/22   Fayrene Helper, PA-C      Allergies    Tylenol [acetaminophen]    Review of Systems   Review of Systems Negative except as per HPI Physical Exam Updated Vital Signs BP (!) 143/82 (BP Location: Right Arm)   Pulse 65   Temp 98.9 F (37.2 C) (Oral)   Resp 14   Ht 6' 3.75" (1.924 m)   Wt 117.9 kg   SpO2 100%   BMI 31.86 kg/m  Physical Exam Vitals and nursing note reviewed.  Constitutional:      General: He is not in acute distress.    Appearance: He is well-developed. He is not diaphoretic.  HENT:     Head: Normocephalic and atraumatic.  Eyes:     Conjunctiva/sclera: Conjunctivae normal.  Cardiovascular:     Rate and Rhythm: Normal rate and regular rhythm.     Pulses: Normal pulses.     Heart sounds: Normal heart sounds.  Pulmonary:     Effort: Pulmonary effort is normal.     Breath sounds: Normal breath sounds.  Abdominal:     Palpations: Abdomen is soft.     Tenderness: There is no abdominal tenderness. There is no right CVA tenderness or left CVA tenderness.  Musculoskeletal:     Right lower leg: No  edema.     Left lower leg: No edema.  Skin:    General: Skin is warm and dry.     Findings: No erythema or rash.  Neurological:     Mental Status: He is alert and oriented to person, place, and time.  Psychiatric:        Behavior: Behavior normal.     ED Results / Procedures / Treatments   Labs (all labs ordered are listed, but only abnormal results are displayed) Labs Reviewed - No data to display  EKG None  Radiology No results found.  Procedures Procedures    Medications Ordered in ED Medications  ondansetron (ZOFRAN-ODT) disintegrating tablet 8 mg (8 mg Oral Given 10/26/22 2232)    ED Course/ Medical Decision Making/ A&P                           Medical Decision Making Risk Prescription drug  management.   26 year old male presents with concern for nausea and vomiting which have resolved 20 minutes prior to my exam.  He is now tolerating ginger ale and Gatorade without difficulty.  His abdomen is soft and nontender.  He is nontoxic and in no distress.  His vitals are reviewed, he is afebrile. Plan is to give Zofran ODT, continue to monitor, if no further vomiting, patient may be discharged with prescription for Zofran ODT.        Final Clinical Impression(s) / ED Diagnoses Final diagnoses:  Nausea and vomiting, unspecified vomiting type    Rx / DC Orders ED Discharge Orders          Ordered    ondansetron (ZOFRAN-ODT) 4 MG disintegrating tablet  Every 8 hours PRN        10/26/22 2236              Jeannie Fend, PA-C 10/26/22 2246    Elayne Snare K, DO 10/26/22 2325

## 2023-01-26 ENCOUNTER — Telehealth: Payer: Self-pay | Admitting: Nurse Practitioner

## 2023-01-26 DIAGNOSIS — U071 COVID-19: Secondary | ICD-10-CM

## 2023-01-26 MED ORDER — ALBUTEROL SULFATE HFA 108 (90 BASE) MCG/ACT IN AERS: 2.0000 | INHALATION_SPRAY | Freq: Four times a day (QID) | RESPIRATORY_TRACT | 0 refills | Status: AC | PRN

## 2023-01-26 MED ORDER — PROMETHAZINE-DM 6.25-15 MG/5ML PO SYRP: 5.0000 mL | ORAL_SOLUTION | Freq: Four times a day (QID) | ORAL | 0 refills | Status: DC | PRN

## 2023-01-26 MED ORDER — MOLNUPIRAVIR EUA 200MG CAPSULE: 4.0000 | ORAL_CAPSULE | Freq: Two times a day (BID) | ORAL | 0 refills | Status: AC

## 2023-01-26 NOTE — Patient Instructions (Signed)
Dennis Simmons, thank you for joining Chevis Pretty, FNP for today's virtual visit.  While this provider is not your primary care provider (PCP), if your PCP is located in our provider database this encounter information will be shared with them immediately following your visit.   [facility] account gives you access to today's visit and all your visits, tests, and labs performed at [facility] " click here if you don't have a [facility] account or go to [email or web address]  Consent: (Patient) Dennis Simmons provided verbal consent for this virtual visit at the beginning of the encounter.  Current Medications:  Current Outpatient Medications:    albuterol (VENTOLIN HFA) 108 (90 Base) MCG/ACT inhaler, Inhale 2 puffs into the lungs every 6 (six) hours as needed for wheezing or shortness of breath., Disp: 8 g, Rfl: 0   molnupiravir EUA (LAGEVRIO) 200 mg CAPS capsule, Take 4 capsules (800 mg total) by mouth 2 (two) times daily for 5 days., Disp: 40 capsule, Rfl: 0   promethazine-dextromethorphan (PROMETHAZINE-DM) 6.25-15 MG/5ML syrup, Take 5 mLs by mouth 4 (four) times daily as needed for cough., Disp: 118 mL, Rfl: 0   albuterol (PROVENTIL HFA;VENTOLIN HFA) 108 (90 Base) MCG/ACT inhaler, Inhale 2 puffs into the lungs every 6 (six) hours as needed for wheezing or shortness of breath., Disp: , Rfl:    meloxicam (MOBIC) 15 MG tablet, Take 1 tablet (15 mg total) by mouth daily as needed for pain., Disp: 30 tablet, Rfl: 0   methocarbamol (ROBAXIN) 500 MG tablet, Take 1 tablet (500 mg total) by mouth every 8 (eight) hours as needed for muscle spasms., Disp: 20 tablet, Rfl: 0   methocarbamol (ROBAXIN) 500 MG tablet, Take 1 tablet (500 mg total) by mouth every 8 (eight) hours as needed for muscle spasms., Disp: 30 tablet, Rfl: 0   naproxen (NAPROSYN) 500 MG tablet, Take 1 tablet (500 mg total) by mouth 2 (two) times daily. (Patient taking differently: Take 500 mg by  mouth daily as needed for moderate pain or headache.), Disp: 30 tablet, Rfl: 0   ondansetron (ZOFRAN) 4 MG tablet, Take 1 tablet (4 mg total) by mouth every 8 (eight) hours as needed for nausea or vomiting., Disp: 12 tablet, Rfl: 0   ondansetron (ZOFRAN-ODT) 4 MG disintegrating tablet, Take 1 tablet (4 mg total) by mouth every 8 (eight) hours as needed for nausea or vomiting., Disp: 12 tablet, Rfl: 0   Medications ordered in this encounter:  Meds ordered this encounter  Medications   molnupiravir EUA (LAGEVRIO) 200 mg CAPS capsule    Sig: Take 4 capsules (800 mg total) by mouth 2 (two) times daily for 5 days.    Dispense:  40 capsule    Refill:  0    Order Specific Question:   Supervising Provider    Answer:   Bari Mantis   albuterol (VENTOLIN HFA) 108 (90 Base) MCG/ACT inhaler    Sig: Inhale 2 puffs into the lungs every 6 (six) hours as needed for wheezing or shortness of breath.    Dispense:  8 g    Refill:  0    Order Specific Question:   Supervising Provider    Answer:   Chase Picket [id number]   promethazine-dextromethorphan (PROMETHAZINE-DM) 6.25-15 MG/5ML syrup    Sig: Take 5 mLs by mouth 4 (four) times daily as needed for cough.    Dispense:  118 mL    Refill:  0    Order Specific  Question:   Supervising Provider    Answer:   Chase Picket [id number]     *If you need refills on other medications prior to your next appointment, please contact your pharmacy*  Follow-Up: Call back or seek an in-person evaluation if the symptoms worsen or if the condition fails to improve as anticipated.  [facility] [phone]  Other Instructions 1. Take meds as prescribed 2. Use a cool mist humidifier especially during the winter months and when heat has been humid. 3. Use saline nose sprays frequently 4. Saline irrigations of the nose can be very helpful if done frequently.  * 4X daily for 1 week*  * Use of a nettie pot can be helpful with this.  Follow directions with this* 5. Drink plenty of fluids 6. Keep thermostat turn down low 7.For any cough or congestion- promethazine dm 8. For fever or aces or pains- take tylenol or ibuprofen appropriate for age and weight.  * for fevers greater than 101 orally you may alternate ibuprofen and tylenol every  3 hours.      If you have been instructed to have an in-person evaluation today at a local Urgent Care facility, please use the link below. It will take you to a list of all of our available [facility], including address, phone number and hours of operation. Please do not delay care.  [facility]  If you or a family member do not have a primary care provider, use the link below to schedule a visit and establish care. When you choose a [facility] primary care physician or advanced practice provider, you gain a long-term partner in health. Find a Primary Care Provider  Learn more about 's in-office and virtual care options: [facility]

## 2023-01-26 NOTE — Progress Notes (Addendum)
Virtual Visit Consent   Dennis Simmons, you are scheduled for a virtual visit with Dennis Hassell Done, FNP, a [facility] provider, today.     Just as with appointments in the office, your consent must be obtained to participate.  Your consent will be active for this visit and any virtual visit you may have with one of our providers in the next 365 days.     If you have a MyChart account, a copy of this consent can be sent to you electronically.  All virtual visits are billed to your insurance company just like a traditional visit in the office.    As this is a virtual visit, video technology does not allow for your provider to perform a traditional examination.  This may limit your provider's ability to fully assess your condition.  If your provider identifies any concerns that need to be evaluated in person or the need to arrange testing (such as labs, EKG, etc.), we will make arrangements to do so.     Although advances in technology are sophisticated, we cannot ensure that it will always work on either your end or our end.  If the connection with a video visit is poor, the visit may have to be switched to a telephone visit.  With either a video or telephone visit, we are not always able to ensure that we have a secure connection.     I need to obtain your verbal consent now.   Are you willing to proceed with your visit today? YES   Dennis Simmons has provided verbal consent on 01/26/2023 for a virtual visit (video or telephone).   Dennis Hassell Done, FNP   Date: 01/26/2023 12:57 PM   Virtual Visit via Video Note   I, Dennis Simmons, connected with Dennis Simmons ([id number], May 03, 1996) on 01/26/23 at  1:00 PM EST by a video-enabled telemedicine application and verified that I am speaking with the correct person using two identifiers.  Location: Patient: Virtual Visit Location Patient: Home Provider: Virtual Visit Location Provider: Mobile   I discussed the limitations of  evaluation and management by telemedicine and the availability of in person appointments. The patient expressed understanding and agreed to proceed.    History of Present Illness: Dennis Simmons is a 27 y.o. who identifies as a male who was assigned male at birth, and is being seen today for covid positive.  HPI: Fiance dx with covid on Monday  URI  This is a new problem. Episode onset: tuesday. The problem has been gradually worsening. There has been no fever. Associated symptoms include congestion, coughing, headaches, rhinorrhea and a sore throat. Pertinent negatives include no sinus pain. He has tried acetaminophen for the symptoms. The treatment provided mild relief.    Review of Systems  Constitutional:  Negative for chills and fever.  HENT:  Positive for congestion, rhinorrhea and sore throat. Negative for sinus pain.   Respiratory:  Positive for cough.   Musculoskeletal:  Positive for myalgias.  Neurological:  Positive for headaches.    Problems:  Patient Active Problem List   Diagnosis Date Noted   Rhabdomyolysis 09/06/2018   Dehydration 09/06/2018   AKI (acute kidney injury) ([facility]) 09/06/2018   Tobacco abuse 09/06/2018   Leukocytosis 09/06/2018   ADHD 09/06/2018   Bacteriuria with pyuria 09/06/2018    Allergies:  Allergies  Allergen Reactions   Tylenol [Acetaminophen] Other (See Comments)    Unknown, "was told by his father, that he had hives at as  an infant"   Medications:  Current Outpatient Medications:    albuterol (PROVENTIL HFA;VENTOLIN HFA) 108 (90 Base) MCG/ACT inhaler, Inhale 2 puffs into the lungs every 6 (six) hours as needed for wheezing or shortness of breath., Disp: , Rfl:    meloxicam (MOBIC) 15 MG tablet, Take 1 tablet (15 mg total) by mouth daily as needed for pain., Disp: 30 tablet, Rfl: 0   methocarbamol (ROBAXIN) 500 MG tablet, Take 1 tablet (500 mg total) by mouth every 8 (eight) hours as needed for muscle spasms., Disp: 20 tablet, Rfl: 0    methocarbamol (ROBAXIN) 500 MG tablet, Take 1 tablet (500 mg total) by mouth every 8 (eight) hours as needed for muscle spasms., Disp: 30 tablet, Rfl: 0   naproxen (NAPROSYN) 500 MG tablet, Take 1 tablet (500 mg total) by mouth 2 (two) times daily. (Patient taking differently: Take 500 mg by mouth daily as needed for moderate pain or headache.), Disp: 30 tablet, Rfl: 0   ondansetron (ZOFRAN) 4 MG tablet, Take 1 tablet (4 mg total) by mouth every 8 (eight) hours as needed for nausea or vomiting., Disp: 12 tablet, Rfl: 0   ondansetron (ZOFRAN-ODT) 4 MG disintegrating tablet, Take 1 tablet (4 mg total) by mouth every 8 (eight) hours as needed for nausea or vomiting., Disp: 12 tablet, Rfl: 0  Observations/Objective: Patient is well-developed, well-nourished in no acute distress.  Resting comfortably  at home.  Head is normocephalic, atraumatic.  No labored breathing.  Speech is clear and coherent with logical content.  Patient is alert and oriented at baseline.  Raspy voice Runny nose Wet cough.  Assessment and Plan:  Dennis Simmons in today with chief complaint of No chief complaint on file.   1. Positive self-administered antigen test for COVID-19 1. Take meds as prescribed 2. Use a cool mist humidifier especially during the winter months and when heat has been humid. 3. Use saline nose sprays frequently 4. Saline irrigations of the nose can be very helpful if Simmons frequently.  * 4X daily for 1 week*  * Use of a nettie pot can be helpful with this. Follow directions with this* 5. Drink plenty of fluids 6. Keep thermostat turn down low 7.For any cough or congestion- promethazine DM 8. For fever or aces or pains- take tylenol or ibuprofen appropriate for age and weight.  * for fevers greater than 101 orally you may alternate ibuprofen and tylenol every  3 hours.   Meds ordered this encounter  Medications   molnupiravir EUA (LAGEVRIO) 200 mg CAPS capsule    Sig: Take 4 capsules (800  mg total) by mouth 2 (two) times daily for 5 days.    Dispense:  40 capsule    Refill:  0    Order Specific Question:   Supervising Provider    Answer:   Bari Mantis   albuterol (VENTOLIN HFA) 108 (90 Base) MCG/ACT inhaler    Sig: Inhale 2 puffs into the lungs every 6 (six) hours as needed for wheezing or shortness of breath.    Dispense:  8 g    Refill:  0    Order Specific Question:   Supervising Provider    Answer:   Chase Picket [id number]   promethazine-dextromethorphan (PROMETHAZINE-DM) 6.25-15 MG/5ML syrup    Sig: Take 5 mLs by mouth 4 (four) times daily as needed for cough.    Dispense:  118 mL    Refill:  0    Order Specific Question:  Supervising Provider    Answer:   Chase Picket [id number]   Patient called back- has no insurance and cannot afford molunpivir called in. Nothing else to give in its place due t no recent blood work. Patient as told will have to treat symptoms.  Follow Up Instructions: I discussed the assessment and treatment plan with the patient. The patient was provided an opportunity to ask questions and all were answered. The patient agreed with the plan and demonstrated an understanding of the instructions.  A copy of instructions were sent to the patient via MyChart.  The patient was advised to call back or seek an in-person evaluation if the symptoms worsen or if the condition fails to improve as anticipated.  Time:  I spent 7 minutes with the patient via telehealth technology discussing the above problems/concerns.    Dennis Hassell Done, FNP

## 2023-05-14 ENCOUNTER — Telehealth: Payer: Self-pay | Admitting: Nurse Practitioner

## 2023-05-14 ENCOUNTER — Encounter: Payer: Self-pay | Admitting: Nurse Practitioner

## 2023-05-14 DIAGNOSIS — R110 Nausea: Secondary | ICD-10-CM

## 2023-05-14 DIAGNOSIS — B349 Viral infection, unspecified: Secondary | ICD-10-CM

## 2023-05-14 MED ORDER — ONDANSETRON 4 MG PO TBDP: 4.0000 mg | ORAL_TABLET | Freq: Three times a day (TID) | ORAL | 0 refills | Status: DC | PRN

## 2023-05-14 MED ORDER — FAMOTIDINE 20 MG PO TABS: 20.0000 mg | ORAL_TABLET | Freq: Two times a day (BID) | ORAL | 0 refills | Status: AC

## 2023-05-14 MED ORDER — CETIRIZINE HCL 10 MG PO TABS: 10.0000 mg | ORAL_TABLET | Freq: Every day | ORAL | 2 refills | Status: AC

## 2023-05-14 NOTE — Progress Notes (Signed)
Virtual Visit Consent   Dennis Simmons, you are scheduled for a virtual visit with a [facility] provider today. Just as with appointments in the office, your consent must be obtained to participate. Your consent will be active for this visit and any virtual visit you may have with one of our providers in the next 365 days. If you have a MyChart account, a copy of this consent can be sent to you electronically.  As this is a virtual visit, video technology does not allow for your provider to perform a traditional examination. This may limit your provider's ability to fully assess your condition. If your provider identifies any concerns that need to be evaluated in person or the need to arrange testing (such as labs, EKG, etc.), we will make arrangements to do so. Although advances in technology are sophisticated, we cannot ensure that it will always work on either your end or our end. If the connection with a video visit is poor, the visit may have to be switched to a telephone visit. With either a video or telephone visit, we are not always able to ensure that we have a secure connection.  By engaging in this virtual visit, you consent to the provision of healthcare and authorize for your insurance to be billed (if applicable) for the services provided during this visit. Depending on your insurance coverage, you may receive a charge related to this service.  I need to obtain your verbal consent now. Are you willing to proceed with your visit today? Dennis Simmons has provided verbal consent on 05/14/2023 for a virtual visit (video or telephone). Viviano Simas, FNP  Date: 05/14/2023 2:52 PM  Virtual Visit via Video Note   I, Viviano Simas, connected with  Dennis Simmons  ([id number], 22-Oct-1996) on 05/14/23 at  3:00 PM EDT by a video-enabled telemedicine application and verified that I am speaking with the correct person using two identifiers.  Location: Patient: Virtual Visit Location Patient:  Home Provider: Virtual Visit Location Provider: Home Office   I discussed the limitations of evaluation and management by telemedicine and the availability of in person appointments. The patient expressed understanding and agreed to proceed.    History of Present Illness: Dennis Simmons is a 27 y.o. who identifies as a male who was assigned male at birth, and is being seen today with complaints of stomachache for the past two days.   He has also had a mild sore throat and a cough   He felt like he had a fever over the past weekend.  Feels he has been sweating more than usual  Has some discomfort in his upper abdomen mostly in the upper right quadrant that started today He has had intermittent nausea  He has had diarrhea  He has not vomited   He has not traveled anywhere recently   He has had a decreased appetite   He does have his gallbladder Does not have an appendix   Problems:  Patient Active Problem List   Diagnosis Date Noted   Rhabdomyolysis 09/06/2018   Dehydration 09/06/2018   AKI (acute kidney injury) ([facility]) 09/06/2018   Tobacco abuse 09/06/2018   Leukocytosis 09/06/2018   ADHD 09/06/2018   Bacteriuria with pyuria 09/06/2018    Allergies:  Allergies  Allergen Reactions   Tylenol [Acetaminophen] Other (See Comments)    Unknown, "was told by his father, that he had hives at as an infant"   Medications:  Current Outpatient Medications:  albuterol (PROVENTIL HFA;VENTOLIN HFA) 108 (90 Base) MCG/ACT inhaler, Inhale 2 puffs into the lungs every 6 (six) hours as needed for wheezing or shortness of breath., Disp: , Rfl:    albuterol (VENTOLIN HFA) 108 (90 Base) MCG/ACT inhaler, Inhale 2 puffs into the lungs every 6 (six) hours as needed for wheezing or shortness of breath., Disp: 8 g, Rfl: 0   naproxen (NAPROSYN) 500 MG tablet, Take 1 tablet (500 mg total) by mouth 2 (two) times daily. (Patient taking differently: Take 500 mg by mouth daily as needed for moderate pain  or headache.), Disp: 30 tablet, Rfl: 0   Observations/Objective: Patient is well-developed, well-nourished in no acute distress.  Resting comfortably  at home.  Head is normocephalic, atraumatic.  No labored breathing.  Speech is clear and coherent with logical content.  Patient is alert and oriented at baseline.    Assessment and Plan: 1. Nausea  - ondansetron (ZOFRAN-ODT) 4 MG disintegrating tablet; Take 1 tablet (4 mg total) by mouth every 8 (eight) hours as needed for nausea or vomiting.  Dispense: 20 tablet; Refill: 0 - famotidine (PEPCID) 20 MG tablet; Take 1 tablet (20 mg total) by mouth 2 (two) times daily for 14 days.  Dispense: 28 tablet; Refill: 0  2. Viral illness  - cetirizine (ZYRTEC ALLERGY) 10 MG tablet; Take 1 tablet (10 mg total) by mouth at bedtime.  Dispense: 30 tablet; Refill: 2    Advised if symptoms persist or with no improvement in the next 48 hours follow up in person for evaluation as discussed   Follow Up Instructions: I discussed the assessment and treatment plan with the patient. The patient was provided an opportunity to ask questions and all were answered. The patient agreed with the plan and demonstrated an understanding of the instructions.  A copy of instructions were sent to the patient via MyChart unless otherwise noted below.    The patient was advised to call back or seek an in-person evaluation if the symptoms worsen or if the condition fails to improve as anticipated.  Time:  I spent 15 minutes with the patient via telehealth technology discussing the above problems/concerns.    Viviano Simas, FNP

## 2023-05-30 ENCOUNTER — Telehealth: Payer: Self-pay | Admitting: Physician Assistant

## 2023-05-30 DIAGNOSIS — K219 Gastro-esophageal reflux disease without esophagitis: Secondary | ICD-10-CM

## 2023-05-30 MED ORDER — PANTOPRAZOLE SODIUM 40 MG PO TBEC: 40.0000 mg | DELAYED_RELEASE_TABLET | Freq: Every day | ORAL | 0 refills | Status: AC

## 2023-05-30 NOTE — Progress Notes (Signed)
Virtual Visit Consent   Dennis Simmons, you are scheduled for a virtual visit with a [facility] provider today. Just as with appointments in the office, your consent must be obtained to participate. Your consent will be active for this visit and any virtual visit you may have with one of our providers in the next 365 days. If you have a MyChart account, a copy of this consent can be sent to you electronically.  As this is a virtual visit, video technology does not allow for your provider to perform a traditional examination. This may limit your provider's ability to fully assess your condition. If your provider identifies any concerns that need to be evaluated in person or the need to arrange testing (such as labs, EKG, etc.), we will make arrangements to do so. Although advances in technology are sophisticated, we cannot ensure that it will always work on either your end or our end. If the connection with a video visit is poor, the visit may have to be switched to a telephone visit. With either a video or telephone visit, we are not always able to ensure that we have a secure connection.  By engaging in this virtual visit, you consent to the provision of healthcare and authorize for your insurance to be billed (if applicable) for the services provided during this visit. Depending on your insurance coverage, you may receive a charge related to this service.  I need to obtain your verbal consent now. Are you willing to proceed with your visit today? Dennis Simmons has provided verbal consent on 05/30/2023 for a virtual visit (video or telephone). Dennis Loveless, PA-C  Date: 05/30/2023 4:43 PM  Virtual Visit via Video Note   IMargaretann Simmons, connected with  Dennis Simmons  ([id number], 07-11-1996) on 05/30/23 at  4:30 PM EDT by a video-enabled telemedicine application and verified that I am speaking with the correct person using two identifiers.  Location: Patient: Virtual Visit Location  Patient: Mobile Provider: Virtual Visit Location Provider: Home Office   I discussed the limitations of evaluation and management by telemedicine and the availability of in person appointments. The patient expressed understanding and agreed to proceed.    History of Present Illness: Dennis Simmons is a 27 y.o. who identifies as a male who was assigned male at birth, and is being seen today for abdominal pain.  HPI: Abdominal Pain This is a recurrent problem. The current episode started more than 1 month ago. The onset quality is gradual. The problem occurs intermittently. Duration: 12-24 hours. The problem has been waxing and waning. The pain is located in the epigastric region and RUQ. The pain is moderate. The quality of the pain is dull, sharp and colicky (mostly feels like a dull pressure constantly but increases to sharp when it is more severe). The abdominal pain radiates to the RUQ. Associated symptoms include anorexia (mild), nausea and vomiting (mostly at night when lying down). Pertinent negatives include no belching, constipation, diarrhea, fever, headaches, hematochezia, melena, myalgias or weight loss. The pain is aggravated by eating and certain positions. The pain is relieved by Nothing. He has tried H2 blockers and proton pump inhibitors (omeprazole and famotidine) for the symptoms. The treatment provided moderate relief.     Problems:  Patient Active Problem List   Diagnosis Date Noted   Rhabdomyolysis 09/06/2018   Dehydration 09/06/2018   AKI (acute kidney injury) ([facility]) 09/06/2018   Tobacco abuse 09/06/2018   Leukocytosis 09/06/2018  ADHD 09/06/2018   Bacteriuria with pyuria 09/06/2018    Allergies:  Allergies  Allergen Reactions   Tylenol [Acetaminophen] Other (See Comments)    Unknown, "was told by his father, that he had hives at as an infant"   Medications:  Current Outpatient Medications:    pantoprazole (PROTONIX) 40 MG tablet, Take 1 tablet (40 mg total) by  mouth daily., Disp: 90 tablet, Rfl: 0   albuterol (PROVENTIL HFA;VENTOLIN HFA) 108 (90 Base) MCG/ACT inhaler, Inhale 2 puffs into the lungs every 6 (six) hours as needed for wheezing or shortness of breath., Disp: , Rfl:    albuterol (VENTOLIN HFA) 108 (90 Base) MCG/ACT inhaler, Inhale 2 puffs into the lungs every 6 (six) hours as needed for wheezing or shortness of breath., Disp: 8 g, Rfl: 0   cetirizine (ZYRTEC ALLERGY) 10 MG tablet, Take 1 tablet (10 mg total) by mouth at bedtime., Disp: 30 tablet, Rfl: 2   famotidine (PEPCID) 20 MG tablet, Take 1 tablet (20 mg total) by mouth 2 (two) times daily for 14 days., Disp: 28 tablet, Rfl: 0   naproxen (NAPROSYN) 500 MG tablet, Take 1 tablet (500 mg total) by mouth 2 (two) times daily. (Patient taking differently: Take 500 mg by mouth daily as needed for moderate pain or headache.), Disp: 30 tablet, Rfl: 0   ondansetron (ZOFRAN-ODT) 4 MG disintegrating tablet, Take 1 tablet (4 mg total) by mouth every 8 (eight) hours as needed for nausea or vomiting., Disp: 20 tablet, Rfl: 0  Observations/Objective: Patient is well-developed, well-nourished in no acute distress.  Resting comfortably  Head is normocephalic, atraumatic.  No labored breathing.  Speech is clear and coherent with logical content.  Patient is alert and oriented at baseline.    Assessment and Plan: 1. Gastroesophageal reflux disease without esophagitis - pantoprazole (PROTONIX) 40 MG tablet; Take 1 tablet (40 mg total) by mouth daily.  Dispense: 90 tablet; Refill: 0  - Suspect GERD, gastritis or PUD - DDx also includes potential gallbladder disease - Will treat with Pantoprazole as above - Food choices for GERD provided in AVS - Take medication for 2 weeks, if not improving or if worsening seek in person evaluation  Follow Up Instructions: I discussed the assessment and treatment plan with the patient. The patient was provided an opportunity to ask questions and all were answered.  The patient agreed with the plan and demonstrated an understanding of the instructions.  A copy of instructions were sent to the patient via MyChart unless otherwise noted below.    The patient was advised to call back or seek an in-person evaluation if the symptoms worsen or if the condition fails to improve as anticipated.  Time:  I spent 15 minutes with the patient via telehealth technology discussing the above problems/concerns.    Dennis Loveless, PA-C

## 2023-05-30 NOTE — Patient Instructions (Signed)
Dennis Simmons, thank you for joining [name], PA-C for today's virtual visit.  While this provider is not your primary care provider (PCP), if your PCP is located in our provider database this encounter information will be shared with them immediately following your visit.   A [name] MyChart account gives you access to today's visit and all your visits, tests, and labs performed at [facility] " click here if you don't have a [name] MyChart account or go to [email or web address]  Consent: (Patient) Dennis Simmons provided verbal consent for this virtual visit at the beginning of the encounter.  Current Medications:  Current Outpatient Medications:    pantoprazole (PROTONIX) 40 MG tablet, Take 1 tablet (40 mg total) by mouth daily., Disp: 90 tablet, Rfl: 0   albuterol (PROVENTIL HFA;VENTOLIN HFA) 108 (90 Base) MCG/ACT inhaler, Inhale 2 puffs into the lungs every 6 (six) hours as needed for wheezing or shortness of breath., Disp: , Rfl:    albuterol (VENTOLIN HFA) 108 (90 Base) MCG/ACT inhaler, Inhale 2 puffs into the lungs every 6 (six) hours as needed for wheezing or shortness of breath., Disp: 8 g, Rfl: 0   cetirizine (ZYRTEC ALLERGY) 10 MG tablet, Take 1 tablet (10 mg total) by mouth at bedtime., Disp: 30 tablet, Rfl: 2   famotidine (PEPCID) 20 MG tablet, Take 1 tablet (20 mg total) by mouth 2 (two) times daily for 14 days., Disp: 28 tablet, Rfl: 0   naproxen (NAPROSYN) 500 MG tablet, Take 1 tablet (500 mg total) by mouth 2 (two) times daily. (Patient taking differently: Take 500 mg by mouth daily as needed for moderate pain or headache.), Disp: 30 tablet, Rfl: 0   ondansetron (ZOFRAN-ODT) 4 MG disintegrating tablet, Take 1 tablet (4 mg total) by mouth every 8 (eight) hours as needed for nausea or vomiting., Disp: 20 tablet, Rfl: 0   Medications ordered in this encounter:  Meds ordered this encounter  Medications   pantoprazole (PROTONIX) 40 MG tablet     Sig: Take 1 tablet (40 mg total) by mouth daily.    Dispense:  90 tablet    Refill:  0    Order Specific Question:   Supervising Provider    Answer:   Merrilee Jansky [id number]     *If you need refills on other medications prior to your next appointment, please contact your pharmacy*  Follow-Up: Call back or seek an in-person evaluation if the symptoms worsen or if the condition fails to improve as anticipated.  [facility] [phone]  Other Instructions Food Choices for Gastroesophageal Reflux Disease, Adult When you have gastroesophageal reflux disease (GERD), the foods you eat and your eating habits are very important. Choosing the right foods can help ease the discomfort of GERD. Consider working with a dietitian to help you make healthy food choices. What are tips for following this plan? Reading food labels Look for foods that are low in saturated fat. Foods that have less than 5% of daily value (DV) of fat and 0 g of trans fats may help with your symptoms. Cooking Cook foods using methods other than frying. This may include baking, steaming, grilling, or broiling. These are all methods that do not need a lot of fat for cooking. To add flavor, try to use herbs that are low in spice and acidity. Meal planning  Choose healthy foods that are low in fat, such as fruits, vegetables, whole grains, low-fat dairy products, lean meats, fish, and  poultry. Eat frequent, small meals instead of three large meals each day. Eat your meals slowly, in a relaxed setting. Avoid bending over or lying down until 2-3 hours after eating. Limit high-fat foods such as fatty meats or fried foods. Limit your intake of fatty foods, such as oils, butter, and shortening. Avoid the following as told by your health care provider: Foods that cause symptoms. These may be different for different people. Keep a food diary to keep track of foods that cause symptoms. Alcohol. Drinking large  amounts of liquid with meals. Eating meals during the 2-3 hours before bed. Lifestyle Maintain a healthy weight. Ask your health care provider what weight is healthy for you. If you need to lose weight, work with your health care provider to do so safely. Exercise for at least 30 minutes on 5 or more days each week, or as told by your health care provider. Avoid wearing clothes that fit tightly around your waist and chest. Do not use any products that contain nicotine or tobacco. These products include cigarettes, chewing tobacco, and vaping devices, such as e-cigarettes. If you need help quitting, ask your health care provider. Sleep with the head of your bed raised. Use a wedge under the mattress or blocks under the bed frame to raise the head of the bed. Chew sugar-free gum after mealtimes. What foods should I eat?  Eat a healthy, well-balanced diet of fruits, vegetables, whole grains, low-fat dairy products, lean meats, fish, and poultry. Each person is different. Foods that may trigger symptoms in one person may not trigger any symptoms in another person. Work with your health care provider to identify foods that are safe for you. The items listed above may not be a complete list of recommended foods and beverages. Contact a dietitian for more information. What foods should I avoid? Limiting some of these foods may help manage the symptoms of GERD. Everyone is different. Consult a dietitian or your health care provider to help you identify the exact foods to avoid, if any. Fruits Any fruits prepared with added fat. Any fruits that cause symptoms. For some people this may include citrus fruits, such as oranges, grapefruit, pineapple, and lemons. Vegetables Deep-fried vegetables. [place] fries. Any vegetables prepared with added fat. Any vegetables that cause symptoms. For some people, this may include tomatoes and tomato products, chili peppers, onions and garlic, and  horseradish. Grains Pastries or quick breads with added fat. Meats and other proteins High-fat meats, such as fatty beef or pork, hot dogs, ribs, ham, sausage, salami, and bacon. Fried meat or protein, including fried fish and fried chicken. Nuts and nut butters, in large amounts. Dairy Whole milk and chocolate milk. Sour cream. Cream. Ice cream. Cream cheese. Milkshakes. Fats and oils Butter. Margarine. Shortening. Ghee. Beverages Coffee and tea, with or without caffeine. Carbonated beverages. Sodas. Energy drinks. Fruit juice made with acidic fruits, such as orange or grapefruit. Tomato juice. Alcoholic drinks. Sweets and desserts Chocolate and cocoa. Donuts. Seasonings and condiments Pepper. Peppermint and spearmint. Added salt. Any condiments, herbs, or seasonings that cause symptoms. For some people, this may include curry, hot sauce, or vinegar-based salad dressings. The items listed above may not be a complete list of foods and beverages to avoid. Contact a dietitian for more information. Questions to ask your health care provider Diet and lifestyle changes are usually the first steps that are taken to manage symptoms of GERD. If diet and lifestyle changes do not improve your symptoms, talk with your  health care provider about taking medicines. Where to find more information International Foundation for Gastrointestinal Disorders: aboutgerd.org Summary When you have gastroesophageal reflux disease (GERD), food and lifestyle choices may be very helpful in easing the discomfort of GERD. Eat frequent, small meals instead of three large meals each day. Eat your meals slowly, in a relaxed setting. Avoid bending over or lying down until 2-3 hours after eating. Limit high-fat foods such as fatty meats or fried foods. This information is not intended to replace advice given to you by your health care provider. Make sure you discuss any questions you have with your health care  provider. Document Revised: 05/30/2020 Document Reviewed: 05/30/2020 Elsevier Patient Education  2024 Elsevier Inc.    If you have been instructed to have an in-person evaluation today at a local Urgent Care facility, please use the link below. It will take you to a list of all of our available [facility], including address, phone number and hours of operation. Please do not delay care.  [facility]  If you or a family member do not have a primary care provider, use the link below to schedule a visit and establish care. When you choose a [facility] primary care physician or advanced practice provider, you gain a long-term partner in health. Find a Primary Care Provider  Learn more about [facility] in-office and virtual care options: [facility] - Get Care Now

## 2023-06-03 ENCOUNTER — Emergency Department (HOSPITAL_COMMUNITY)
Admission: EM | Admit: 2023-06-03 | Discharge: 2023-06-04 | Disposition: A | Discharge status: Home / Self Care | Payer: Self-pay | Attending: Emergency Medicine | Admitting: Emergency Medicine

## 2023-06-03 DIAGNOSIS — R1030 Lower abdominal pain, unspecified: Secondary | ICD-10-CM | POA: Insufficient documentation

## 2023-06-03 DIAGNOSIS — R109 Unspecified abdominal pain: Secondary | ICD-10-CM

## 2023-06-03 LAB — COMPREHENSIVE METABOLIC PANEL
ALT: 22 U/L (ref 0–44)
AST: 20 U/L (ref 15–41)
Albumin: 3.7 g/dL (ref 3.5–5.0)
Alkaline Phosphatase: 94 U/L (ref 38–126)
Anion gap: 9 (ref 5–15)
BUN: <5 mg/dL — ABNORMAL LOW (ref 6–20)
CO2: 25 mmol/L (ref 22–32)
Calcium: 8.8 mg/dL — ABNORMAL LOW (ref 8.9–10.3)
Chloride: 103 mmol/L (ref 98–111)
Creatinine, Ser: 1.1 mg/dL (ref 0.61–1.24)
GFR, Estimated: >60 mL/min (ref >60)
Glucose, Bld: 96 mg/dL (ref 70–99)
Potassium: 3.5 mmol/L (ref 3.5–5.1)
Sodium: 137 mmol/L (ref 135–145)
Total Bilirubin: 1.2 mg/dL (ref 0.3–1.2)
Total Protein: 6.7 g/dL (ref 6.5–8.1)

## 2023-06-03 LAB — URINALYSIS, ROUTINE W REFLEX MICROSCOPIC
Bilirubin Urine: NEGATIVE
Glucose, UA: NEGATIVE mg/dL
Ketones, ur: NEGATIVE mg/dL
Leukocytes,Ua: NEGATIVE
Nitrite: NEGATIVE
Protein, ur: NEGATIVE mg/dL
Specific Gravity, Urine: 1.018 (ref 1.005–1.030)
pH: 5 (ref 5.0–8.0)

## 2023-06-03 LAB — CBC
HCT: 45.9 % (ref 39.0–52.0)
Hemoglobin: 15.3 g/dL (ref 13.0–17.0)
MCH: 31.1 pg (ref 26.0–34.0)
MCHC: 33.3 g/dL (ref 30.0–36.0)
MCV: 93.3 fL (ref 80.0–100.0)
Platelets: 265 10*3/uL (ref 150–400)
RBC: 4.92 MIL/uL (ref 4.22–5.81)
RDW: 13.9 % (ref 11.5–15.5)
WBC: 10 10*3/uL (ref 4.0–10.5)
nRBC: 0 % (ref 0.0–0.2)

## 2023-06-03 LAB — LIPASE, BLOOD: Lipase: 40 U/L (ref 11–51)

## 2023-06-03 MED ORDER — LIDOCAINE VISCOUS HCL 2 % MT SOLN
15.0000 mL | Freq: Once | OROMUCOSAL | Status: AC
Start: 2023-06-03 — End: 2023-06-04
  Administered 2023-06-04: 15 mL via ORAL
  Filled 2023-06-03: qty 15

## 2023-06-03 MED ORDER — ONDANSETRON 4 MG PO TBDP
4.0000 mg | ORAL_TABLET | Freq: Once | ORAL | Status: AC
Start: 2023-06-03 — End: 2023-06-04
  Administered 2023-06-04: 4 mg via ORAL
  Filled 2023-06-03: qty 1

## 2023-06-03 MED ORDER — HYOSCYAMINE SULFATE 0.125 MG SL SUBL
0.2500 mg | SUBLINGUAL_TABLET | Freq: Once | SUBLINGUAL | Status: AC
  Administered 2023-06-04: 0.25 mg via ORAL
  Filled 2023-06-03: qty 2

## 2023-06-03 MED ORDER — ALUM & MAG HYDROXIDE-SIMETH 200-200-20 MG/5ML PO SUSP
30.0000 mL | Freq: Once | ORAL | Status: AC
  Administered 2023-06-04: 30 mL via ORAL
  Filled 2023-06-03: qty 30

## 2023-06-03 NOTE — ED Triage Notes (Signed)
Pt to ED c/o lower abdominal pain x 2 weeks, intermittent in nature. Also c/o n/v x 3 weeks. No s/s of acute distress noted in triage.

## 2023-06-03 NOTE — ED Provider Notes (Signed)
[facility] AT [facility] Provider Note  CSN: [id number] Arrival date & time: 06/03/23 1731  Chief Complaint(s) Abdominal Pain  HPI Dennis Simmons is a 27 y.o. male with a past medical history listed below who presents to the emergency department with several weeks of intermittent abdominal pain.  Initially epigastric and right upper quadrant.  At times postprandial.  Over the past several days has been more prominent in the lower abdomen.  Patient endorsing black stools starting today.  Reports nausea and nonbloody nonbilious emesis over the past several weeks but worse in the last several days.  Patient denies any urinary symptoms.  Patient has called the  telemedicine line and been treated for GERD with minimal relief. The history is provided by the patient.    Past Medical History Past Medical History:  Diagnosis Date   Bronchitis    Patient Active Problem List   Diagnosis Date Noted   Rhabdomyolysis 09/06/2018   Dehydration 09/06/2018   AKI (acute kidney injury) (HCC) 09/06/2018   Tobacco abuse 09/06/2018   Leukocytosis 09/06/2018   ADHD 09/06/2018   Bacteriuria with pyuria 09/06/2018   Home Medication(s) Prior to Admission medications   Medication Sig Start Date End Date Taking? Authorizing Provider  hyoscyamine (LEVSIN/SL) 0.125 MG SL tablet Place 2 tablets (0.25 mg total) under the tongue 4 (four) times daily as needed for up to 5 days. 06/04/23 06/09/23 Yes Lashanta Elbe, Amadeo Garnet, MD  lidocaine (XYLOCAINE) 2 % solution Use as directed 10 mLs in the mouth or throat every 6 (six) hours as needed (stomach pain). 06/04/23  Yes Reynoldo Mainer, Amadeo Garnet, MD  albuterol (PROVENTIL HFA;VENTOLIN HFA) 108 (90 Base) MCG/ACT inhaler Inhale 2 puffs into the lungs every 6 (six) hours as needed for wheezing or shortness of breath.    [provider]  albuterol (VENTOLIN HFA) 108 (90 Base) MCG/ACT inhaler Inhale 2 puffs into the lungs every 6  (six) hours as needed for wheezing or shortness of breath. 01/26/23   Daphine Deutscher Mary-Margaret, FNP  cetirizine (ZYRTEC ALLERGY) 10 MG tablet Take 1 tablet (10 mg total) by mouth at bedtime. 05/14/23 08/12/23  Viviano Simas, FNP  famotidine (PEPCID) 20 MG tablet Take 1 tablet (20 mg total) by mouth 2 (two) times daily for 14 days. 05/14/23 05/28/23  Viviano Simas, FNP  naproxen (NAPROSYN) 500 MG tablet Take 1 tablet (500 mg total) by mouth 2 (two) times daily. Patient taking differently: Take 500 mg by mouth daily as needed for moderate pain or headache. 11/08/19   Henderly, Britni A, PA-C  ondansetron (ZOFRAN-ODT) 4 MG disintegrating tablet Take 1 tablet (4 mg total) by mouth every 8 (eight) hours as needed for nausea or vomiting. 05/14/23   Viviano Simas, FNP  pantoprazole (PROTONIX) 40 MG tablet Take 1 tablet (40 mg total) by mouth daily. 05/30/23   [name], PA-C  Allergies Tylenol [acetaminophen]  Review of Systems Review of Systems As noted in HPI  Physical Exam Vital Signs  I have reviewed the triage vital signs BP 129/84 (BP Location: Right Arm)   Pulse (!) 50   Temp 98.9 F (37.2 C) (Oral)   Resp 16   Ht 6\' 3"  (1.905 m)   Wt 113.4 kg   SpO2 100%   BMI 31.25 kg/m   Physical Exam Vitals reviewed.  Constitutional:      General: He is not in acute distress.    Appearance: He is well-developed. He is not diaphoretic.  HENT:     Head: Normocephalic and atraumatic.     Right Ear: External ear normal.     Left Ear: External ear normal.     Nose: Nose normal.     Mouth/Throat:     Mouth: Mucous membranes are moist.  Eyes:     General: No scleral icterus.    Conjunctiva/sclera: Conjunctivae normal.  Neck:     Trachea: Phonation normal.  Cardiovascular:     Rate and Rhythm: Normal rate and regular rhythm.  Pulmonary:     Effort: Pulmonary  effort is normal. No respiratory distress.     Breath sounds: No stridor.  Abdominal:     General: There is no distension.     Tenderness: There is abdominal tenderness (mild) in the suprapubic area. There is no guarding or rebound.  Musculoskeletal:        General: Normal range of motion.     Cervical back: Normal range of motion.  Neurological:     Mental Status: He is alert and oriented to person, place, and time.  Psychiatric:        Behavior: Behavior normal.     ED Results and Treatments Labs (all labs ordered are listed, but only abnormal results are displayed) Labs Reviewed  COMPREHENSIVE METABOLIC PANEL - Abnormal; Notable for the following components:      Result Value   BUN <5 (*)    Calcium 8.8 (*)    All other components within normal limits  URINALYSIS, ROUTINE W REFLEX MICROSCOPIC - Abnormal; Notable for the following components:   Hgb urine dipstick SMALL (*)    Bacteria, UA RARE (*)    All other components within normal limits  LIPASE, BLOOD  CBC                                                                                                                         EKG  EKG Interpretation Date/Time:    Ventricular Rate:    PR Interval:    QRS Duration:    QT Interval:    QTC Calculation:   R Axis:      Text Interpretation:         Radiology No results found.  Medications Ordered in ED Medications  ondansetron (ZOFRAN-ODT) disintegrating tablet 4 mg (4 mg Oral Given 06/04/23 0008)  alum & mag hydroxide-simeth (MAALOX/MYLANTA) 200-200-20 MG/5ML suspension  30 mL (30 mLs Oral Given 06/04/23 0008)    And  lidocaine (XYLOCAINE) 2 % viscous mouth solution 15 mL (15 mLs Oral Given 06/04/23 0004)  hyoscyamine (LEVSIN SL) SL tablet 0.25 mg (0.25 mg Oral Given 06/04/23 0006)   Procedures Procedures  (including critical care time) Medical Decision Making / ED Course   Medical Decision Making Amount and/or Complexity of Data Reviewed Labs: ordered.  Decision-making details documented in ED Course.  Risk OTC drugs. Prescription drug management.    Intermittent abdominal pain, migrating but now in the lower abdomen. Patient is status post appendectomy in 2017 Differential includes but not limited to intestinal colic/functional disease.  Possible internal hemorrhoids, gastritis, peptic ulcer disease, biliary disease.  Will assess for urinary tract infection.  No tenderness to palpation of the epigastrium concerning for pancreatitis.  CBC without leukocytosis or anemia. CMP without significant electrolyte derangements or renal sufficiency.  No evidence of bili obstruction or pancreatitis. BUN WNL - unlikely upper GI bleed UA without evidence of infection  Will treat with GI cocktail Clinical Course as of 06/04/23 0057  Tue Jun 04, 2023  0057 Near complete resolution  [PC]    Clinical Course User Index [PC] Nira Conn, MD    Final Clinical Impression(s) / ED Diagnoses Final diagnoses:  Intermittent abdominal pain   The patient appears reasonably screened and/or stabilized for discharge and I doubt any other medical condition or other [facility] requiring further screening, evaluation, or treatment in the ED at this time. I have discussed the findings, Dx and Tx plan with the patient/family who expressed understanding and agree(s) with the plan. Discharge instructions discussed at length. The patient/family was given strict return precautions who verbalized understanding of the instructions. No further questions at time of discharge.  Disposition: Discharge  Condition: Good  ED Discharge Orders          Ordered    hyoscyamine (LEVSIN/SL) 0.125 MG SL tablet  4 times daily PRN        06/04/23 0056    lidocaine (XYLOCAINE) 2 % solution  Every 6 hours PRN        06/04/23 0056             Follow Up: Primary care provider  Call  to schedule an appointment for close follow up     This chart was dictated using  voice recognition software.  Despite best efforts to proofread,  errors can occur which can change the documentation meaning.    Nira Conn, MD 06/04/23 [phone]

## 2023-06-04 MED ORDER — LIDOCAINE VISCOUS HCL 2 % MT SOLN: 10.0000 mL | Freq: Four times a day (QID) | OROMUCOSAL | 0 refills | Status: DC | PRN

## 2023-06-04 MED ORDER — HYOSCYAMINE SULFATE 0.125 MG SL SUBL: 0.2500 mg | SUBLINGUAL_TABLET | Freq: Four times a day (QID) | SUBLINGUAL | 0 refills | Status: AC | PRN

## 2023-10-10 ENCOUNTER — Emergency Department (HOSPITAL_BASED_OUTPATIENT_CLINIC_OR_DEPARTMENT_OTHER)
Admission: EM | Admit: 2023-10-10 | Discharge: 2023-10-10 | Discharge status: Against Medical Advice | Payer: Self-pay | Attending: Emergency Medicine | Admitting: Emergency Medicine

## 2023-10-10 ENCOUNTER — Encounter (HOSPITAL_BASED_OUTPATIENT_CLINIC_OR_DEPARTMENT_OTHER): Payer: Self-pay | Admitting: Emergency Medicine

## 2023-10-10 ENCOUNTER — Other Ambulatory Visit: Payer: Self-pay

## 2023-10-10 ENCOUNTER — Telehealth: Payer: Self-pay | Admitting: Physician Assistant

## 2023-10-10 DIAGNOSIS — J029 Acute pharyngitis, unspecified: Secondary | ICD-10-CM

## 2023-10-10 DIAGNOSIS — Z5321 Procedure and treatment not carried out due to patient leaving prior to being seen by health care provider: Secondary | ICD-10-CM | POA: Insufficient documentation

## 2023-10-10 LAB — GROUP A STREP BY PCR: Group A Strep by PCR: NOT DETECTED

## 2023-10-10 NOTE — ED Triage Notes (Signed)
Pt arrived POV from home. States he did a e-visit with PCP today and sent here for strep vs abscess. Had fever at home 101. Afebrile in triage 98.7. Throat red and swollen on L side of throat.
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
## 2023-10-10 NOTE — Patient Instructions (Signed)
  Dennis Simmons, thank you for joining Laure Kidney, PA-C for today's virtual visit.  While this provider is not your primary care provider (PCP), if your PCP is located in our provider database this encounter information will be shared with them immediately following your visit.   A [facility] MyChart account gives you access to today's visit and all your visits, tests, and labs performed at [facility] " click here if you don't have a [facility] MyChart account or go to [email or web address]  Consent: (Patient) Dennis Simmons provided verbal consent for this virtual visit at the beginning of the encounter.  Current Medications:  Current Outpatient Medications:    albuterol (PROVENTIL HFA;VENTOLIN HFA) 108 (90 Base) MCG/ACT inhaler, Inhale 2 puffs into the lungs every 6 (six) hours as needed for wheezing or shortness of breath., Disp: , Rfl:    albuterol (VENTOLIN HFA) 108 (90 Base) MCG/ACT inhaler, Inhale 2 puffs into the lungs every 6 (six) hours as needed for wheezing or shortness of breath., Disp: 8 g, Rfl: 0   cetirizine (ZYRTEC ALLERGY) 10 MG tablet, Take 1 tablet (10 mg total) by mouth at bedtime., Disp: 30 tablet, Rfl: 2   famotidine (PEPCID) 20 MG tablet, Take 1 tablet (20 mg total) by mouth 2 (two) times daily for 14 days., Disp: 28 tablet, Rfl: 0   hyoscyamine (LEVSIN/SL) 0.125 MG SL tablet, Place 2 tablets (0.25 mg total) under the tongue 4 (four) times daily as needed for up to 5 days., Disp: 30 tablet, Rfl: 0   lidocaine (XYLOCAINE) 2 % solution, Use as directed 10 mLs in the mouth or throat every 6 (six) hours as needed (stomach pain)., Disp: 100 mL, Rfl: 0   naproxen (NAPROSYN) 500 MG tablet, Take 1 tablet (500 mg total) by mouth 2 (two) times daily. (Patient taking differently: Take 500 mg by mouth daily as needed for moderate pain or headache.), Disp: 30 tablet, Rfl: 0   ondansetron (ZOFRAN-ODT) 4 MG disintegrating tablet, Take 1 tablet (4 mg total) by mouth  every 8 (eight) hours as needed for nausea or vomiting., Disp: 20 tablet, Rfl: 0   pantoprazole (PROTONIX) 40 MG tablet, Take 1 tablet (40 mg total) by mouth daily., Disp: 90 tablet, Rfl: 0   Medications ordered in this encounter:  No orders of the defined types were placed in this encounter.    *If you need refills on other medications prior to your next appointment, please contact your pharmacy*  Follow-Up: Call back or seek an in-person evaluation if the symptoms worsen or if the condition fails to improve as anticipated.  [facility] [phone]  Other Instructions Report to ER ASAP for evaluation.    If you have been instructed to have an in-person evaluation today at a local Urgent Care facility, please use the link below. It will take you to a list of all of our available [place] [facility], including address, phone number and hours of operation. Please do not delay care.  [facility]  If you or a family member do not have a primary care provider, use the link below to schedule a visit and establish care. When you choose a [facility] primary care physician or advanced practice provider, you gain a long-term partner in health. Find a Primary Care Provider  Learn more about [facility] in-office and virtual care options: [facility] - Get Care Now

## 2023-10-11 ENCOUNTER — Telehealth: Payer: Self-pay | Admitting: Physician Assistant

## 2023-10-11 DIAGNOSIS — J020 Streptococcal pharyngitis: Secondary | ICD-10-CM

## 2023-10-11 MED ORDER — AMOXICILLIN 500 MG PO CAPS: 500.0000 mg | ORAL_CAPSULE | Freq: Two times a day (BID) | ORAL | 0 refills | Status: DC

## 2023-10-11 MED ORDER — LIDOCAINE VISCOUS HCL 2 % MT SOLN: OROMUCOSAL | 0 refills | Status: AC

## 2023-10-11 MED ORDER — PSEUDOEPH-BROMPHEN-DM 30-2-10 MG/5ML PO SYRP: 5.0000 mL | ORAL_SOLUTION | Freq: Four times a day (QID) | ORAL | 0 refills | Status: DC | PRN

## 2023-10-11 NOTE — Progress Notes (Signed)

## 2023-10-11 NOTE — Addendum Note (Signed)
Addended by: [name] on: 10/11/2023 04:50 PM   Modules accepted: Orders

## 2023-10-15 ENCOUNTER — Telehealth: Payer: Self-pay | Admitting: Physician Assistant

## 2023-10-15 DIAGNOSIS — K047 Periapical abscess without sinus: Secondary | ICD-10-CM

## 2023-10-15 MED ORDER — AMOXICILLIN-POT CLAVULANATE 875-125 MG PO TABS: 1.0000 | ORAL_TABLET | Freq: Two times a day (BID) | ORAL | 0 refills | Status: AC
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
## 2023-10-15 NOTE — Patient Instructions (Signed)
Blayn P Sesma, thank you for joining Piedad Climes, PA-C for today's virtual visit.  While this provider is not your primary care provider (PCP), if your PCP is located in our provider database this encounter information will be shared with them immediately following your visit.   A [facility] MyChart account gives you access to today's visit and all your visits, tests, and labs performed at [facility] " click here if you don't have a [facility] MyChart account or go to [email or web address]  Consent: (Patient) Dennis Simmons provided verbal consent for this virtual visit at the beginning of the encounter.  Current Medications:  Current Outpatient Medications:    albuterol (PROVENTIL HFA;VENTOLIN HFA) 108 (90 Base) MCG/ACT inhaler, Inhale 2 puffs into the lungs every 6 (six) hours as needed for wheezing or shortness of breath., Disp: , Rfl:    albuterol (VENTOLIN HFA) 108 (90 Base) MCG/ACT inhaler, Inhale 2 puffs into the lungs every 6 (six) hours as needed for wheezing or shortness of breath., Disp: 8 g, Rfl: 0   amoxicillin (AMOXIL) 500 MG capsule, Take 1 capsule (500 mg total) by mouth 2 (two) times daily for 10 days., Disp: 20 capsule, Rfl: 0   brompheniramine-pseudoephedrine-DM 30-2-10 MG/5ML syrup, Take 5 mLs by mouth 4 (four) times daily as needed., Disp: 120 mL, Rfl: 0   cetirizine (ZYRTEC ALLERGY) 10 MG tablet, Take 1 tablet (10 mg total) by mouth at bedtime., Disp: 30 tablet, Rfl: 2   famotidine (PEPCID) 20 MG tablet, Take 1 tablet (20 mg total) by mouth 2 (two) times daily for 14 days., Disp: 28 tablet, Rfl: 0   hyoscyamine (LEVSIN/SL) 0.125 MG SL tablet, Place 2 tablets (0.25 mg total) under the tongue 4 (four) times daily as needed for up to 5 days., Disp: 30 tablet, Rfl: 0   lidocaine (XYLOCAINE) 2 % solution, Swallow 5mL every 6 hours as needed for sore throat, Disp: 100 mL, Rfl: 0   naproxen (NAPROSYN) 500 MG tablet, Take 1 tablet (500 mg total) by mouth  2 (two) times daily. (Patient taking differently: Take 500 mg by mouth daily as needed for moderate pain or headache.), Disp: 30 tablet, Rfl: 0   ondansetron (ZOFRAN-ODT) 4 MG disintegrating tablet, Take 1 tablet (4 mg total) by mouth every 8 (eight) hours as needed for nausea or vomiting., Disp: 20 tablet, Rfl: 0   pantoprazole (PROTONIX) 40 MG tablet, Take 1 tablet (40 mg total) by mouth daily., Disp: 90 tablet, Rfl: 0   Medications ordered in this encounter:  No orders of the defined types were placed in this encounter.    *If you need refills on other medications prior to your next appointment, please contact your pharmacy*  Follow-Up: Call back or seek an in-person evaluation if the symptoms worsen or if the condition fails to improve as anticipated.  [facility] [phone]  Other Instructions Dental Abscess  A dental abscess is an area of pus in or around a tooth. It comes from an infection. It can cause pain and other symptoms. Treatment will help with symptoms and prevent the infection from spreading. What are the causes? This condition is caused by an infection in or around the tooth. This can be from: Very bad tooth decay (cavities). A bad injury to the tooth, such as a broken or chipped tooth. What increases the risk? The risk to get an abscess is higher in males. It is also more likely in people who: Have dental decay. Have  very bad gum disease. Eat sugary snacks between meals. Use tobacco. Have diabetes. Have a weak disease-fighting system (immune system). Do not brush their teeth regularly. What are the signs or symptoms? Some mild symptoms are: Tenderness. Bad breath. Fever. A sharp, sour taste in the mouth. Pain in and around the infected tooth. Worse symptoms of this condition include: Swollen neck glands. Chills. Pus draining around the tooth. Swelling and redness around the tooth, the mouth, or the face. Very bad pain in and around the  tooth. The worst symptoms can include: Difficulty swallowing. Difficulty opening your mouth. Feeling like you may vomit or vomiting. How is this treated? This is treated by getting rid of the infection. Your dentist will discuss ways to do this, including: Antibiotic medicines. Antibacterial mouth rinse. An incision in the abscess to drain out the pus. A root canal. Removing the tooth. Follow these instructions at home: Medicines Take over-the-counter and prescription medicines only as told by your dentist. If you were prescribed an antibiotic medicine, take it as told by your dentist. Do not stop taking it even if you start to feel better. If you were prescribed a gel that has numbing medicine in it, use it exactly as told. Ask your dentist if you should avoid driving or using machines while you are taking your medicine. General instructions Rinse your mouth often with salt water. To make salt water, dissolve -1 tsp (3-6 g) of salt in 1 cup (237 mL) of warm water. Eat a soft diet while your mouth is healing. Drink enough fluid to keep your pee (urine) pale yellow. Do not apply heat to the outside of your mouth. Do not smoke or use any products that contain nicotine or tobacco. If you need help quitting, ask your dentist. Keep all follow-up visits. Prevent an abscess Brush your teeth every morning and every night. Use fluoride toothpaste. Floss your teeth each day. Get dental cleanings as often as told by your dentist. Think about getting dental sealant put on teeth that have deep holes (decay). Drink water that has fluoride in it. Most tap water has fluoride. Check the label on bottled water to see if it has fluoride in it. Drink water instead of sugary drinks. Eat healthy meals and snacks. Wear a mouth guard or face shield when you play sports. Contact a doctor if: Your pain is worse and medicine does not help. Get help right away if: You have a fever or chills. Your  symptoms suddenly get worse. You have a very bad headache. You have problems breathing or swallowing. You have trouble opening your mouth. You have swelling in your neck or close to your eye. These symptoms may be an emergency. Get help right away. Call your local emergency services (911 in the [place] Do not wait to see if the symptoms will go away. Do not drive yourself to the hospital. Summary A dental abscess is an area of pus in or around a tooth. It is caused by an infection. Treatment will help with symptoms and prevent the infection from spreading. Take over-the-counter and prescription medicines only as told by your dentist. To prevent an abscess, take good care of your teeth. Brush your teeth every morning and night. Use floss every day. Get dental cleanings as often as told by your dentist. This information is not intended to replace advice given to you by your health care provider. Make sure you discuss any questions you have with your health care provider. Document Revised: 01/26/2021 Document  Reviewed: 01/26/2021 Elsevier Patient Education  2024 Elsevier Inc.    If you have been instructed to have an in-person evaluation today at a local Urgent Care facility, please use the link below. It will take you to a list of all of our available [facility], including address, phone number and hours of operation. Please do not delay care.  [facility]  If you or a family member do not have a primary care provider, use the link below to schedule a visit and establish care. When you choose a [facility] primary care physician or advanced practice provider, you gain a long-term partner in health. Find a Primary Care Provider  Learn more about [facility]'s in-office and virtual care options: [facility] - Get Care Now

## 2024-04-28 ENCOUNTER — Other Ambulatory Visit: Payer: Self-pay

## 2024-04-28 ENCOUNTER — Encounter (HOSPITAL_BASED_OUTPATIENT_CLINIC_OR_DEPARTMENT_OTHER): Payer: Self-pay

## 2024-04-28 ENCOUNTER — Emergency Department (HOSPITAL_BASED_OUTPATIENT_CLINIC_OR_DEPARTMENT_OTHER): Payer: Self-pay | Admitting: Radiology

## 2024-04-28 ENCOUNTER — Emergency Department (HOSPITAL_BASED_OUTPATIENT_CLINIC_OR_DEPARTMENT_OTHER)
Admission: EM | Admit: 2024-04-28 | Discharge: 2024-04-28 | Disposition: A | Discharge status: Home / Self Care | Payer: Self-pay | Attending: Emergency Medicine | Admitting: Emergency Medicine

## 2024-04-28 DIAGNOSIS — R079 Chest pain, unspecified: Secondary | ICD-10-CM | POA: Insufficient documentation

## 2024-04-28 DIAGNOSIS — R112 Nausea with vomiting, unspecified: Secondary | ICD-10-CM | POA: Insufficient documentation

## 2024-04-28 DIAGNOSIS — R0789 Other chest pain: Secondary | ICD-10-CM

## 2024-04-28 LAB — LIPASE, BLOOD: Lipase: 68 U/L — ABNORMAL HIGH (ref 11–51)

## 2024-04-28 LAB — CBC
HCT: 49.3 % (ref 39.0–52.0)
Hemoglobin: 16.9 g/dL (ref 13.0–17.0)
MCH: 30.8 pg (ref 26.0–34.0)
MCHC: 34.3 g/dL (ref 30.0–36.0)
MCV: 89.8 fL (ref 80.0–100.0)
Platelets: 253 10*3/uL (ref 150–400)
RBC: 5.49 MIL/uL (ref 4.22–5.81)
RDW: 13.8 % (ref 11.5–15.5)
WBC: 10.1 10*3/uL (ref 4.0–10.5)
nRBC: 0 % (ref 0.0–0.2)

## 2024-04-28 LAB — TROPONIN T, HIGH SENSITIVITY
Troponin T High Sensitivity: <15 ng/L (ref <19)
Troponin T High Sensitivity: <15 ng/L (ref <19)

## 2024-04-28 LAB — BASIC METABOLIC PANEL WITH GFR
Anion gap: 15 (ref 5–15)
BUN: 11 mg/dL (ref 6–20)
CO2: 24 mmol/L (ref 22–32)
Calcium: 10 mg/dL (ref 8.9–10.3)
Chloride: 102 mmol/L (ref 98–111)
Creatinine, Ser: 1.04 mg/dL (ref 0.61–1.24)
GFR, Estimated: >60 mL/min (ref >60)
Glucose, Bld: 98 mg/dL (ref 70–99)
Potassium: 4.3 mmol/L (ref 3.5–5.1)
Sodium: 141 mmol/L (ref 135–145)

## 2024-04-28 LAB — HEPATIC FUNCTION PANEL
ALT: 37 U/L (ref 0–44)
AST: 29 U/L (ref 15–41)
Albumin: 4.7 g/dL (ref 3.5–5.0)
Alkaline Phosphatase: 129 U/L — ABNORMAL HIGH (ref 38–126)
Bilirubin, Direct: 0.4 mg/dL — ABNORMAL HIGH (ref 0.0–0.2)
Indirect Bilirubin: 0.5 mg/dL
Total Bilirubin: 0.9 mg/dL (ref 0.0–1.2)
Total Protein: 8.1 g/dL (ref 6.5–8.1)

## 2024-04-28 MED ORDER — ONDANSETRON 8 MG PO TBDP: ORAL_TABLET | ORAL | 0 refills | Status: DC

## 2024-04-28 MED ORDER — KETOROLAC TROMETHAMINE 30 MG/ML IJ SOLN
30.0000 mg | Freq: Once | INTRAMUSCULAR | Status: AC
  Administered 2024-04-28: 30 mg via INTRAVENOUS
  Filled 2024-04-28: qty 1

## 2024-04-28 MED ORDER — ONDANSETRON HCL 4 MG/2ML IJ SOLN
4.0000 mg | Freq: Once | INTRAMUSCULAR | Status: AC
  Administered 2024-04-28: 4 mg via INTRAVENOUS
  Filled 2024-04-28: qty 2

## 2024-04-28 MED ORDER — SODIUM CHLORIDE 0.9 % IV BOLUS
1000.0000 mL | Freq: Once | INTRAVENOUS | Status: AC
  Administered 2024-04-28: 1000 mL via INTRAVENOUS

## 2024-04-28 NOTE — ED Provider Notes (Signed)
 [place] EMERGENCY DEPARTMENT AT [facility] Provider Note   CSN: [id number] Arrival date & time: 04/28/24  0047     History  Chief Complaint  Patient presents with   Chest Pain    Dennis Simmons is a 28 y.o. male.  Patient is a 28 year old male with past medical history of prior appendectomy, ADHD.  Patient presenting today for evaluation of nausea, vomiting, and chest discomfort.  Symptoms started yesterday after a cookout.  He describes multiple episodes of nausea and vomiting, all of which is nonbloody.  Shortly after vomiting, he started experiencing some discomfort in his chest.  He denies any shortness of breath.  No fevers or chills.  No cough.  He denies any ill contacts.       Home Medications Prior to Admission medications   Medication Sig Start Date End Date Taking? Authorizing Provider  albuterol  (PROVENTIL  HFA;VENTOLIN  HFA) 108 (90 Base) MCG/ACT inhaler Inhale 2 puffs into the lungs every 6 (six) hours as needed for wheezing or shortness of breath.    [provider]  albuterol  (VENTOLIN  HFA) 108 (90 Base) MCG/ACT inhaler Inhale 2 puffs into the lungs every 6 (six) hours as needed for wheezing or shortness of breath. 01/26/23   Delfina Feller, FNP  amoxicillin -clavulanate (AUGMENTIN ) 875-125 MG tablet Take 1 tablet by mouth 2 (two) times daily. 10/15/23   Farris Hong, PA-C  cetirizine  (ZYRTEC  ALLERGY) 10 MG tablet Take 1 tablet (10 mg total) by mouth at bedtime. 05/14/23 08/12/23  Mardene Shake, FNP  famotidine  (PEPCID ) 20 MG tablet Take 1 tablet (20 mg total) by mouth 2 (two) times daily for 14 days. 05/14/23 05/28/23  Mardene Shake, FNP  hyoscyamine  (LEVSIN/SL) 0.125 MG SL tablet Place 2 tablets (0.25 mg total) under the tongue 4 (four) times daily as needed for up to 5 days. 06/04/23 06/09/23  Lindle Rhea, MD  lidocaine  (XYLOCAINE ) 2 % solution Swallow 5mL every 6 hours as needed for sore throat 10/11/23   Angelia Kelp, PA-C   naproxen  (NAPROSYN ) 500 MG tablet Take 1 tablet (500 mg total) by mouth 2 (two) times daily. Patient taking differently: Take 500 mg by mouth daily as needed for moderate pain or headache. 11/08/19   Henderly, Britni A, PA-C  ondansetron  (ZOFRAN -ODT) 4 MG disintegrating tablet Take 1 tablet (4 mg total) by mouth every 8 (eight) hours as needed for nausea or vomiting. 05/14/23   Mardene Shake, FNP  pantoprazole  (PROTONIX ) 40 MG tablet Take 1 tablet (40 mg total) by mouth daily. 05/30/23   Angelia Kelp, PA-C      Allergies    Tylenol  [acetaminophen ]    Review of Systems   Review of Systems  All other systems reviewed and are negative.   Physical Exam Updated Vital Signs BP 134/87   Pulse 80   Temp 98.9 F (37.2 C) (Oral)   Resp 16   SpO2 96%  Physical Exam Vitals and nursing note reviewed.  Constitutional:      General: He is not in acute distress.    Appearance: He is well-developed. He is not diaphoretic.  HENT:     Head: Normocephalic and atraumatic.  Cardiovascular:     Rate and Rhythm: Normal rate and regular rhythm.     Heart sounds: No murmur heard.    No friction rub.  Pulmonary:     Effort: Pulmonary effort is normal. No respiratory distress.     Breath sounds: Normal breath sounds. No wheezing or rales.  Abdominal:     General: Bowel sounds are normal. There is no distension.     Palpations: Abdomen is soft.     Tenderness: There is no abdominal tenderness.  Musculoskeletal:        General: Normal range of motion.     Cervical back: Normal range of motion and neck supple.  Skin:    General: Skin is warm and dry.  Neurological:     Mental Status: He is alert and oriented to person, place, and time.     Coordination: Coordination normal.     ED Results / Procedures / Treatments   Labs (all labs ordered are listed, but only abnormal results are displayed) Labs Reviewed  CBC  BASIC METABOLIC PANEL WITH GFR  LIPASE, BLOOD  HEPATIC FUNCTION PANEL   TROPONIN T, HIGH SENSITIVITY    EKG EKG Interpretation Date/Time:  Tuesday Apr 28 2024 00:56:09 EDT Ventricular Rate:  72 PR Interval:  145 QRS Duration:  112 QT Interval:  368 QTC Calculation: 403 R Axis:   50  Text Interpretation: Sinus rhythm Incomplete right bundle branch block No significant change since 11/21/2014 Confirmed by Orvilla Blander ([id number]) on 04/28/2024 1:00:24 AM  Radiology DG Chest Port 1 View Result Date: 04/28/2024 CLINICAL DATA:  Nausea, vomiting, chest pain EXAM: PORTABLE CHEST 1 VIEW COMPARISON:  Radiographs 11/21/2014 FINDINGS: Stable cardiomediastinal silhouette. No focal consolidation, pleural effusion, or pneumothorax. No displaced rib fractures. IMPRESSION: No active disease. Electronically Signed   By: Rozell Cornet M.D.   On: 04/28/2024 01:21    Procedures Procedures    Medications Ordered in ED Medications  sodium chloride  0.9 % bolus 1,000 mL (1,000 mLs Intravenous New Bag/Given 04/28/24 0129)  ondansetron  (ZOFRAN ) injection 4 mg (4 mg Intravenous Given 04/28/24 0127)  ketorolac  (TORADOL ) 30 MG/ML injection 30 mg (30 mg Intravenous Given 04/28/24 0127)    ED Course/ Medical Decision Making/ A&P  Patient is a 28 year old male presenting with complaints of nausea, vomiting, and burning in his chest.  Patient arrives with stable vital signs and is afebrile.  Abdomen is benign on exam.  Laboratory studies obtained including CBC, CMP, lipase, and troponin.  Lipase is mildly elevated, but laboratory studies otherwise unremarkable.  Patient chest x-ray showed no acute process.  Patient hydrated with normal saline and has been given Zofran  for nausea and Toradol  for pain.  He now seems to be feeling better and tolerating p.o. without difficulty.  Causes of his symptoms most likely viral or possibly foodborne in nature.  I suspect some irritation to his esophagus from vomiting and that to be the cause of his chest discomfort.  Nothing seems cardiac or  emergent.  Final Clinical Impression(s) / ED Diagnoses Final diagnoses:  None    Rx / DC Orders ED Discharge Orders     None         Orvilla Blander, MD 04/28/24 [phone]

## 2024-04-28 NOTE — ED Triage Notes (Signed)
 N/V all day, Chest pain started this evening. Has HTN, has not been taking medications ( Amlodipine)

## 2024-04-28 NOTE — Discharge Instructions (Signed)
 Begin taking Zofran  as prescribed as needed for nausea.  Clear liquids for the next 12 hours, then slowly advance diet as tolerated.  Return to the ER for worsening vomiting, severe abdominal pain, high fevers, bloody stools, or for other new and concerning symptoms.

## 2024-05-16 ENCOUNTER — Telehealth: Payer: Self-pay | Admitting: Nurse Practitioner

## 2024-05-16 DIAGNOSIS — B349 Viral infection, unspecified: Secondary | ICD-10-CM

## 2024-05-16 DIAGNOSIS — R110 Nausea: Secondary | ICD-10-CM

## 2024-05-16 MED ORDER — ONDANSETRON 8 MG PO TBDP: 8.0000 mg | ORAL_TABLET | Freq: Three times a day (TID) | ORAL | 0 refills | Status: AC | PRN

## 2024-05-16 NOTE — Progress Notes (Signed)
 Virtual Visit Consent   Ether P Velis, you are scheduled for a virtual visit with a [facility] provider today. Just as with appointments in the office, your consent must be obtained to participate. Your consent will be active for this visit and any virtual visit you may have with one of our providers in the next 365 days. If you have a MyChart account, a copy of this consent can be sent to you electronically.  As this is a virtual visit, video technology does not allow for your provider to perform a traditional examination. This may limit your provider's ability to fully assess your condition. If your provider identifies any concerns that need to be evaluated in person or the need to arrange testing (such as labs, EKG, etc.), we will make arrangements to do so. Although advances in technology are sophisticated, we cannot ensure that it will always work on either your end or our end. If the connection with a video visit is poor, the visit may have to be switched to a telephone visit. With either a video or telephone visit, we are not always able to ensure that we have a secure connection.  By engaging in this virtual visit, you consent to the provision of healthcare and authorize for your insurance to be billed (if applicable) for the services provided during this visit. Depending on your insurance coverage, you may receive a charge related to this service.  I need to obtain your verbal consent now. Are you willing to proceed with your visit today? Dennis Simmons has provided verbal consent on 05/16/2024 for a virtual visit (video or telephone). Collins Dean, NP  Date: 05/16/2024 10:26 AM   Virtual Visit via Video Note   I, Collins Dean, connected with  Dennis Simmons  ([id number], 09/24/1996) on 05/16/24 at 10:15 AM EDT by a video-enabled telemedicine application and verified that I am speaking with the correct person using two identifiers.  Location: Patient: Virtual Visit Location Patient:  Home Provider: Virtual Visit Location Provider: Home Office   I discussed the limitations of evaluation and management by telemedicine and the availability of in person appointments. The patient expressed understanding and agreed to proceed.    History of Present Illness: Dennis Simmons is a 28 y.o. who identifies as a male who was assigned male at birth, and is being seen today for viral gastroenteritis.  Mr Poser states he has been experiencing nausea, vomiting and diarrhea over the past 24 hours. Currently taking alka seltzer and milk of magnesia with no relief of symptoms. He was seen in the hospital last month for similar symptoms and prescribed zofran  which he states today that he never picked up from the pharmacy.    Problems:  Patient Active Problem List   Diagnosis Date Noted   Rhabdomyolysis 09/06/2018   Dehydration 09/06/2018   AKI (acute kidney injury) (HCC) 09/06/2018   Tobacco abuse 09/06/2018   Leukocytosis 09/06/2018   ADHD 09/06/2018   Bacteriuria with pyuria 09/06/2018    Allergies:  Allergies  Allergen Reactions   Tylenol  [Acetaminophen ] Other (See Comments)    Unknown, was told by his father, that he had hives at as an infant   Medications:  Current Outpatient Medications:    albuterol  (PROVENTIL  HFA;VENTOLIN  HFA) 108 (90 Base) MCG/ACT inhaler, Inhale 2 puffs into the lungs every 6 (six) hours as needed for wheezing or shortness of breath., Disp: , Rfl:    albuterol  (VENTOLIN  HFA) 108 (90 Base) MCG/ACT inhaler, Inhale  2 puffs into the lungs every 6 (six) hours as needed for wheezing or shortness of breath., Disp: 8 g, Rfl: 0   amoxicillin -clavulanate (AUGMENTIN ) 875-125 MG tablet, Take 1 tablet by mouth 2 (two) times daily., Disp: 20 tablet, Rfl: 0   cetirizine  (ZYRTEC  ALLERGY) 10 MG tablet, Take 1 tablet (10 mg total) by mouth at bedtime., Disp: 30 tablet, Rfl: 2   famotidine  (PEPCID ) 20 MG tablet, Take 1 tablet (20 mg total) by mouth 2 (two) times daily for 14  days., Disp: 28 tablet, Rfl: 0   hyoscyamine  (LEVSIN /SL) 0.125 MG SL tablet, Place 2 tablets (0.25 mg total) under the tongue 4 (four) times daily as needed for up to 5 days., Disp: 30 tablet, Rfl: 0   lidocaine  (XYLOCAINE ) 2 % solution, Swallow 5mL every 6 hours as needed for sore throat, Disp: 100 mL, Rfl: 0   naproxen  (NAPROSYN ) 500 MG tablet, Take 1 tablet (500 mg total) by mouth 2 (two) times daily. (Patient taking differently: Take 500 mg by mouth daily as needed for moderate pain or headache.), Disp: 30 tablet, Rfl: 0   ondansetron  (ZOFRAN -ODT) 8 MG disintegrating tablet, Take 1 tablet (8 mg total) by mouth every 8 (eight) hours as needed for nausea or vomiting., Disp: 30 tablet, Rfl: 0   pantoprazole  (PROTONIX ) 40 MG tablet, Take 1 tablet (40 mg total) by mouth daily., Disp: 90 tablet, Rfl: 0  Observations/Objective: Patient is well-developed, well-nourished in no acute distress.  Resting comfortably  at home.  Head is normocephalic, atraumatic.  No labored breathing.  Speech is clear and coherent with logical content.  Patient is alert and oriented at baseline.    Assessment and Plan: 1. Viral illness - ondansetron  (ZOFRAN -ODT) 8 MG disintegrating tablet; Take 1 tablet (8 mg total) by mouth every 8 (eight) hours as needed for nausea or vomiting.  Dispense: 30 tablet; Refill: 0  2. Nausea (Primary) - ondansetron  (ZOFRAN -ODT) 8 MG disintegrating tablet; Take 1 tablet (8 mg total) by mouth every 8 (eight) hours as needed for nausea or vomiting.  Dispense: 30 tablet; Refill: 0  Instructed to try imodium if MOM ineffective  Follow Up Instructions: I discussed the assessment and treatment plan with the patient. The patient was provided an opportunity to ask questions and all were answered. The patient agreed with the plan and demonstrated an understanding of the instructions.  A copy of instructions were sent to the patient via MyChart unless otherwise noted below.    The patient was  advised to call back or seek an in-person evaluation if the symptoms worsen or if the condition fails to improve as anticipated.    Bentli Llorente W Kolt Mcwhirter, NP

## 2024-05-16 NOTE — Patient Instructions (Signed)
 Quinto P Reever, thank you for joining Collins Dean, NP for today's virtual visit.  While this provider is not your primary care provider (PCP), if your PCP is located in our provider database this encounter information will be shared with them immediately following your visit.   A [facility] MyChart account gives you access to today's visit and all your visits, tests, and labs performed at [facility]  click here if you don't have a [facility] MyChart account or go to [email or web address]  Consent: (Patient) Dennis Simmons provided verbal consent for this virtual visit at the beginning of the encounter.  Current Medications:  Current Outpatient Medications:    albuterol  (PROVENTIL  HFA;VENTOLIN  HFA) 108 (90 Base) MCG/ACT inhaler, Inhale 2 puffs into the lungs every 6 (six) hours as needed for wheezing or shortness of breath., Disp: , Rfl:    albuterol  (VENTOLIN  HFA) 108 (90 Base) MCG/ACT inhaler, Inhale 2 puffs into the lungs every 6 (six) hours as needed for wheezing or shortness of breath., Disp: 8 g, Rfl: 0   amoxicillin -clavulanate (AUGMENTIN ) 875-125 MG tablet, Take 1 tablet by mouth 2 (two) times daily., Disp: 20 tablet, Rfl: 0   cetirizine  (ZYRTEC  ALLERGY) 10 MG tablet, Take 1 tablet (10 mg total) by mouth at bedtime., Disp: 30 tablet, Rfl: 2   famotidine  (PEPCID ) 20 MG tablet, Take 1 tablet (20 mg total) by mouth 2 (two) times daily for 14 days., Disp: 28 tablet, Rfl: 0   hyoscyamine  (LEVSIN /SL) 0.125 MG SL tablet, Place 2 tablets (0.25 mg total) under the tongue 4 (four) times daily as needed for up to 5 days., Disp: 30 tablet, Rfl: 0   lidocaine  (XYLOCAINE ) 2 % solution, Swallow 5mL every 6 hours as needed for sore throat, Disp: 100 mL, Rfl: 0   naproxen  (NAPROSYN ) 500 MG tablet, Take 1 tablet (500 mg total) by mouth 2 (two) times daily. (Patient taking differently: Take 500 mg by mouth daily as needed for moderate pain or headache.), Disp: 30 tablet, Rfl: 0    ondansetron  (ZOFRAN -ODT) 8 MG disintegrating tablet, Take 1 tablet (8 mg total) by mouth every 8 (eight) hours as needed for nausea or vomiting., Disp: 30 tablet, Rfl: 0   pantoprazole  (PROTONIX ) 40 MG tablet, Take 1 tablet (40 mg total) by mouth daily., Disp: 90 tablet, Rfl: 0   Medications ordered in this encounter:  Meds ordered this encounter  Medications   ondansetron  (ZOFRAN -ODT) 8 MG disintegrating tablet    Sig: Take 1 tablet (8 mg total) by mouth every 8 (eight) hours as needed for nausea or vomiting.    Dispense:  30 tablet    Refill:  0    Supervising Provider:   LAMPTEY, PHILIP O [[id number]]     *If you need refills on other medications prior to your next appointment, please contact your pharmacy*  Follow-Up: Call back or seek an in-person evaluation if the symptoms worsen or if the condition fails to improve as anticipated.  [facility] [phone]  Other Instructions Instructed to try imodium if MOM ineffective   If you have been instructed to have an in-person evaluation today at a local Urgent Care facility, please use the link below. It will take you to a list of all of our available [facility], including address, phone number and hours of operation. Please do not delay care.  [facility]  If you or a family member do not have a primary care provider, use the  link below to schedule a visit and establish care. When you choose a [facility] primary care physician or advanced practice provider, you gain a long-term partner in health. Find a Primary Care Provider  Learn more about [facility]'s in-office and virtual care options: [facility] - Get Care Now

## 2024-08-20 ENCOUNTER — Encounter (INDEPENDENT_AMBULATORY_CARE_PROVIDER_SITE_OTHER): Payer: Self-pay

## 2024-12-29 ENCOUNTER — Telehealth: Payer: Self-pay | Admitting: Physician Assistant

## 2024-12-29 DIAGNOSIS — L0291 Cutaneous abscess, unspecified: Secondary | ICD-10-CM

## 2024-12-29 DIAGNOSIS — J069 Acute upper respiratory infection, unspecified: Secondary | ICD-10-CM

## 2024-12-29 MED ORDER — PROMETHAZINE-DM 6.25-15 MG/5ML PO SYRP: 5.0000 mL | ORAL_SOLUTION | Freq: Four times a day (QID) | ORAL | 0 refills | Status: AC | PRN

## 2024-12-29 MED ORDER — DOXYCYCLINE HYCLATE 100 MG PO TABS: 100.0000 mg | ORAL_TABLET | Freq: Two times a day (BID) | ORAL | 0 refills | Status: AC

## 2024-12-29 NOTE — Patient Instructions (Signed)
 " Ashe P Nguyenthi, thank you for joining Delon CHRISTELLA Dickinson, PA-C for today's virtual visit.  While this provider is not your primary care provider (PCP), if your PCP is located in our provider database this encounter information will be shared with them immediately following your visit.   A [facility] MyChart account gives you access to today's visit and all your visits, tests, and labs performed at [facility]  click here if you don't have a [facility] MyChart account or go to [email or web address]  Consent: (Patient) Dennis Simmons provided verbal consent for this virtual visit at the beginning of the encounter.  Current Medications:  Current Outpatient Medications:    doxycycline  (VIBRA -TABS) 100 MG tablet, Take 1 tablet (100 mg total) by mouth 2 (two) times daily., Disp: 20 tablet, Rfl: 0   promethazine -dextromethorphan (PROMETHAZINE -DM) 6.25-15 MG/5ML syrup, Take 5 mLs by mouth 4 (four) times daily as needed., Disp: 118 mL, Rfl: 0   albuterol  (PROVENTIL  HFA;VENTOLIN  HFA) 108 (90 Base) MCG/ACT inhaler, Inhale 2 puffs into the lungs every 6 (six) hours as needed for wheezing or shortness of breath., Disp: , Rfl:    albuterol  (VENTOLIN  HFA) 108 (90 Base) MCG/ACT inhaler, Inhale 2 puffs into the lungs every 6 (six) hours as needed for wheezing or shortness of breath., Disp: 8 g, Rfl: 0   amoxicillin -clavulanate (AUGMENTIN ) 875-125 MG tablet, Take 1 tablet by mouth 2 (two) times daily., Disp: 20 tablet, Rfl: 0   cetirizine  (ZYRTEC  ALLERGY) 10 MG tablet, Take 1 tablet (10 mg total) by mouth at bedtime., Disp: 30 tablet, Rfl: 2   famotidine  (PEPCID ) 20 MG tablet, Take 1 tablet (20 mg total) by mouth 2 (two) times daily for 14 days., Disp: 28 tablet, Rfl: 0   hyoscyamine  (LEVSIN /SL) 0.125 MG SL tablet, Place 2 tablets (0.25 mg total) under the tongue 4 (four) times daily as needed for up to 5 days., Disp: 30 tablet, Rfl: 0   lidocaine  (XYLOCAINE ) 2 % solution, Swallow 5mL every 6  hours as needed for sore throat, Disp: 100 mL, Rfl: 0   naproxen  (NAPROSYN ) 500 MG tablet, Take 1 tablet (500 mg total) by mouth 2 (two) times daily. (Patient taking differently: Take 500 mg by mouth daily as needed for moderate pain or headache.), Disp: 30 tablet, Rfl: 0   ondansetron  (ZOFRAN -ODT) 8 MG disintegrating tablet, Take 1 tablet (8 mg total) by mouth every 8 (eight) hours as needed for nausea or vomiting., Disp: 30 tablet, Rfl: 0   pantoprazole  (PROTONIX ) 40 MG tablet, Take 1 tablet (40 mg total) by mouth daily., Disp: 90 tablet, Rfl: 0   Medications ordered in this encounter:  Meds ordered this encounter  Medications   doxycycline  (VIBRA -TABS) 100 MG tablet    Sig: Take 1 tablet (100 mg total) by mouth 2 (two) times daily.    Dispense:  20 tablet    Refill:  0    Supervising Provider:   LAMPTEY, PHILIP O [[id number]]   promethazine -dextromethorphan (PROMETHAZINE -DM) 6.25-15 MG/5ML syrup    Sig: Take 5 mLs by mouth 4 (four) times daily as needed.    Dispense:  118 mL    Refill:  0    Supervising Provider:   BLAISE ALEENE KIDD [[id number]]     *If you need refills on other medications prior to your next appointment, please contact your pharmacy*  Follow-Up: Call back or seek an in-person evaluation if the symptoms worsen or if the condition fails to improve as anticipated.  [facility]  [facility] [phone]  Other Instructions  Hidradenitis Suppurativa Hidradenitis suppurativa is a long-term (chronic) skin disease. It is similar to a severe form of acne, but it affects areas of the body where acne would be unusual, especially areas of the body where skin rubs against skin and becomes moist. These include: Underarms. Groin. Genital area. Buttocks. Upper thighs. Breasts. Hidradenitis suppurativa may start out as small lumps or pimples caused by blocked skin pores, sweat glands, or hair follicles. Pimples may develop into deep sores that break open (rupture) and drain pus.  Over time, affected areas of skin may thicken and become scarred. This condition is rare and does not spread from person to person (non-contagious). What are the causes? The exact cause of this condition is not known. It may be related to: Male and male hormones. An overactive disease-fighting system (immune system). The immune system may over-react to blocked hair follicles or sweat glands and cause swelling and pus-filled sores. What increases the risk? You are more likely to develop this condition if you: Are male. Are 49-61 years old. Have a family history of hidradenitis suppurativa. Have a personal history of acne. Are overweight. Smoke. Take the medicine lithium. What are the signs or symptoms? The first symptoms are usually painful bumps in the skin, similar to pimples. The condition may get worse over time (progress), or it may only cause mild symptoms. If the disease progresses, symptoms may include: Skin bumps getting bigger and growing deeper into the skin. Bumps rupturing and draining pus. Itchy, infected skin. Skin getting thicker and scarred. Tunnels under the skin (fistulas) where pus drains from a bump. Pain during daily activities, such as pain during walking if your groin area is affected. Emotional problems, such as stress or depression. This condition may affect your appearance and your ability or willingness to wear certain clothes or do certain activities. How is this diagnosed? This condition is diagnosed by a health care provider who specializes in skin conditions (dermatologist). You may be diagnosed based on: Your symptoms and medical history. A physical exam. Testing a pus sample for infection. Blood tests. How is this treated? Your treatment will depend on how severe your symptoms are. The same treatment will not work for everybody with this condition. You may need to try several treatments to find what works best for you. Treatment may  include: Cleaning and bandaging (dressing) your wounds as needed. Lifestyle changes, such as new skin care routines. Taking medicines, such as: Antibiotics. Acne medicines. Medicines to reduce the activity of the immune system. A diabetes medicine (metformin). Birth control pills, for women. Steroids to reduce swelling and pain. Working with a mental health care provider, if you experience emotional distress due to this condition. If you have severe symptoms that do not get better with medicine, you may need surgery. Surgery may involve: Using a laser to clear the skin and remove hair follicles. Opening and draining deep sores. Removing the areas of skin that are diseased and scarred. Follow these instructions at home: Medicines  Take over-the-counter and prescription medicines only as told by your health care provider. If you were prescribed antibiotics, take them as told by your health care provider. Do not stop using the antibiotic even if your condition improves. Skin care If you have open wounds, cover them with a clean dressing as told by your health care provider. Keep wounds clean by washing them gently with soap and water when you bathe. Do not shave the areas where you get  hidradenitis suppurativa. Wear loose-fitting clothes. Try to avoid getting overheated or sweaty. If you get sweaty or wet, change into clean, dry clothes as soon as you can. To help relieve pain and itchiness, cover sore areas with a warm, clean washcloth (warm compress) for 5-10 minutes as often as needed. Your healthcare provider may recommend an antiperspirant deodorant that may be gentle on your skin. A daily antiseptic wash to cleanse affected areas may be suggested by your healthcare provider. General instructions Learn as much as you can about your disease so that you have an active role in your treatment. Work closely with your health care provider to find treatments that work for you. If you are  overweight, work with your health care provider to lose weight as recommended. Do not use any products that contain nicotine  or tobacco. These products include cigarettes, chewing tobacco, and vaping devices, such as e-cigarettes. If you need help quitting, ask your health care provider. If you struggle with living with this condition, talk with your health care provider or work with a mental health care provider as recommended. Keep all follow-up visits. Where to find more information Hidradenitis Suppurativa Foundation, Inc.: www.hs-foundation.org American Academy of Dermatology: infoexam.si Contact a health care provider if: You have a flare-up of hidradenitis suppurativa. You have a fever or chills. You have trouble controlling your symptoms at home. You have trouble doing your daily activities because of your symptoms. You have trouble dealing with emotional problems related to your condition. Summary Hidradenitis suppurativa is a long-term (chronic) skin disease. It is similar to a severe form of acne, but it affects areas of the body where acne would be unusual. The first symptoms are usually painful bumps in the skin, similar to pimples. The condition may only cause mild symptoms, or it may get worse over time (progress). If you have open wounds, cover them with a clean dressing as told by your health care provider. Keep wounds clean by washing them gently with soap and water when you bathe. Besides skin care, treatment may include medicines, laser treatment, and surgery. This information is not intended to replace advice given to you by your health care provider. Make sure you discuss any questions you have with your health care provider. Document Revised: 01/10/2022 Document Reviewed: 01/10/2022 Elsevier Patient Education  2024 Elsevier Inc.   If you have been instructed to have an in-person evaluation today at a local Urgent Care facility, please use the link below. It will take  you to a list of all of our available [facility], including address, phone number and hours of operation. Please do not delay care.  [facility]  If you or a family member do not have a primary care provider, use the link below to schedule a visit and establish care. When you choose a [facility] primary care physician or advanced practice provider, you gain a long-term partner in health. Find a Primary Care Provider  Learn more about [facility]'s in-office and virtual care options: [facility] - Get Care Now "

## 2024-12-29 NOTE — Progress Notes (Signed)
 " Virtual Visit Consent   Dennis Simmons, you are scheduled for a virtual visit with a [facility] provider today. Just as with appointments in the office, your consent must be obtained to participate. Your consent will be active for this visit and any virtual visit you may have with one of our providers in the next 365 days. If you have a MyChart account, a copy of this consent can be sent to you electronically.  As this is a virtual visit, video technology does not allow for your provider to perform a traditional examination. This may limit your provider's ability to fully assess your condition. If your provider identifies any concerns that need to be evaluated in person or the need to arrange testing (such as labs, EKG, etc.), we will make arrangements to do so. Although advances in technology are sophisticated, we cannot ensure that it will always work on either your end or our end. If the connection with a video visit is poor, the visit may have to be switched to a telephone visit. With either a video or telephone visit, we are not always able to ensure that we have a secure connection.  By engaging in this virtual visit, you consent to the provision of healthcare and authorize for your insurance to be billed (if applicable) for the services provided during this visit. Depending on your insurance coverage, you may receive a charge related to this service.  I need to obtain your verbal consent now. Are you willing to proceed with your visit today? Dennis Simmons has provided verbal consent on 12/29/2024 for a virtual visit (video or telephone). Delon CHRISTELLA Dickinson, PA-C  Date: 12/29/2024 3:31 PM   Virtual Visit via Video Note   IDelon CHRISTELLA Dickinson, connected with  Dennis Simmons  ([id number], 05/24/1996) on 12/29/24 at  3:15 PM EST by a video-enabled telemedicine application and verified that I am speaking with the correct person using two identifiers.  Location: Patient: Virtual Visit Location  Patient: Home Provider: Virtual Visit Location Provider: Home Office   I discussed the limitations of evaluation and management by telemedicine and the availability of in person appointments. The patient expressed understanding and agreed to proceed.    History of Present Illness: Dennis Simmons is a 29 y.o. who identifies as a male who was assigned male at birth, and is being seen today for boils in the groin area, inner thighs and lower lateral legs. Has had these issues in the past and required incision and drainage, but it is not as severe as that time and trying to avoid it progressing to that. This is a recurrent issue. He has tried aleve  without improvements. He does have a family history of HS in his mother.  He is also reporting some mild generalized body aches, upper respiratory congestion, headache, and cough. These symptoms started a couple of days ago. He has been trying Advil  cold and sinus PM and Nyquil without relief of symptoms.  HPI: HPI  Problems:  Patient Active Problem List   Diagnosis Date Noted   Rhabdomyolysis 09/06/2018   Dehydration 09/06/2018   AKI (acute kidney injury) 09/06/2018   Tobacco abuse 09/06/2018   Leukocytosis 09/06/2018   ADHD 09/06/2018   Bacteriuria with pyuria 09/06/2018    Allergies: Allergies[1] Medications: Current Medications[2]  Observations/Objective: Patient is well-developed, well-nourished in no acute distress.  Resting comfortably at home.  Head is normocephalic, atraumatic.  No labored breathing.  Speech is clear and coherent with logical  content.  Patient is alert and oriented at baseline.    Assessment and Plan: 1. Cutaneous abscess, unspecified site (Primary) - doxycycline  (VIBRA -TABS) 100 MG tablet; Take 1 tablet (100 mg total) by mouth 2 (two) times daily.  Dispense: 20 tablet; Refill: 0  2. Viral URI with cough - promethazine -dextromethorphan (PROMETHAZINE -DM) 6.25-15 MG/5ML syrup; Take 5 mLs by mouth 4 (four) times  daily as needed.  Dispense: 118 mL; Refill: 0  - Having boils, recurrent, with family history of HS - Will add Doxycycline  for acute treatment - Discussed adding warm compresses or Epsom salt soaks  - May continue Aleve , and can add in Tylenol  as needed - Can do direct warm compresses to areas that are more painful - Will add Promethazine  DM for cough and congestion - Advised can use symptomatic medications of choice OTC for cold/URI symptoms - Work note provided - Seek in person evaluation if not improving or if worsening  Follow Up Instructions: I discussed the assessment and treatment plan with the patient. The patient was provided an opportunity to ask questions and all were answered. The patient agreed with the plan and demonstrated an understanding of the instructions.  A copy of instructions were sent to the patient via MyChart unless otherwise noted below.    The patient was advised to call back or seek an in-person evaluation if the symptoms worsen or if the condition fails to improve as anticipated.    Delon CHRISTELLA Dickinson, PA-C     [1]  Allergies Allergen Reactions   Tylenol  [Acetaminophen ] Other (See Comments)    Unknown, was told by his father, that he had hives at as an infant  [2]  Current Outpatient Medications:    doxycycline  (VIBRA -TABS) 100 MG tablet, Take 1 tablet (100 mg total) by mouth 2 (two) times daily., Disp: 20 tablet, Rfl: 0   promethazine -dextromethorphan (PROMETHAZINE -DM) 6.25-15 MG/5ML syrup, Take 5 mLs by mouth 4 (four) times daily as needed., Disp: 118 mL, Rfl: 0   albuterol  (PROVENTIL  HFA;VENTOLIN  HFA) 108 (90 Base) MCG/ACT inhaler, Inhale 2 puffs into the lungs every 6 (six) hours as needed for wheezing or shortness of breath., Disp: , Rfl:    albuterol  (VENTOLIN  HFA) 108 (90 Base) MCG/ACT inhaler, Inhale 2 puffs into the lungs every 6 (six) hours as needed for wheezing or shortness of breath., Disp: 8 g, Rfl: 0   amoxicillin -clavulanate  (AUGMENTIN ) 875-125 MG tablet, Take 1 tablet by mouth 2 (two) times daily., Disp: 20 tablet, Rfl: 0   cetirizine  (ZYRTEC  ALLERGY) 10 MG tablet, Take 1 tablet (10 mg total) by mouth at bedtime., Disp: 30 tablet, Rfl: 2   famotidine  (PEPCID ) 20 MG tablet, Take 1 tablet (20 mg total) by mouth 2 (two) times daily for 14 days., Disp: 28 tablet, Rfl: 0   hyoscyamine  (LEVSIN /SL) 0.125 MG SL tablet, Place 2 tablets (0.25 mg total) under the tongue 4 (four) times daily as needed for up to 5 days., Disp: 30 tablet, Rfl: 0   lidocaine  (XYLOCAINE ) 2 % solution, Swallow 5mL every 6 hours as needed for sore throat, Disp: 100 mL, Rfl: 0   naproxen  (NAPROSYN ) 500 MG tablet, Take 1 tablet (500 mg total) by mouth 2 (two) times daily. (Patient taking differently: Take 500 mg by mouth daily as needed for moderate pain or headache.), Disp: 30 tablet, Rfl: 0   ondansetron  (ZOFRAN -ODT) 8 MG disintegrating tablet, Take 1 tablet (8 mg total) by mouth every 8 (eight) hours as needed for nausea or vomiting., Disp: 30  tablet, Rfl: 0   pantoprazole  (PROTONIX ) 40 MG tablet, Take 1 tablet (40 mg total) by mouth daily., Disp: 90 tablet, Rfl: 0  "
